# Patient Record
Sex: Female | Born: 1965 | Race: White | Hispanic: No | Marital: Single | Smoking: Former smoker
Health system: Southern US, Community
[De-identification: ages and names within clinical notes are randomized; demographics above are authoritative.]

## PROBLEM LIST (undated history)

## (undated) DIAGNOSIS — N3281 Overactive bladder: Secondary | ICD-10-CM

## (undated) DIAGNOSIS — E039 Hypothyroidism, unspecified: Secondary | ICD-10-CM

## (undated) DIAGNOSIS — K219 Gastro-esophageal reflux disease without esophagitis: Secondary | ICD-10-CM

## (undated) DIAGNOSIS — N393 Stress incontinence (female) (male): Secondary | ICD-10-CM

## (undated) DIAGNOSIS — Z973 Presence of spectacles and contact lenses: Secondary | ICD-10-CM

## (undated) DIAGNOSIS — E079 Disorder of thyroid, unspecified: Secondary | ICD-10-CM

## (undated) DIAGNOSIS — T7840XA Allergy, unspecified, initial encounter: Secondary | ICD-10-CM

## (undated) DIAGNOSIS — G4733 Obstructive sleep apnea (adult) (pediatric): Secondary | ICD-10-CM

## (undated) DIAGNOSIS — I10 Essential (primary) hypertension: Secondary | ICD-10-CM

## (undated) HISTORY — PX: SHOULDER SURGERY: SHX246

## (undated) HISTORY — DX: Gastro-esophageal reflux disease without esophagitis: K21.9

## (undated) HISTORY — DX: Essential (primary) hypertension: I10

## (undated) HISTORY — DX: Disorder of thyroid, unspecified: E07.9

## (undated) HISTORY — DX: Allergy, unspecified, initial encounter: T78.40XA

## (undated) HISTORY — PX: NOSE SURGERY: SHX723

---

## 1998-06-27 ENCOUNTER — Inpatient Hospital Stay (HOSPITAL_COMMUNITY): Admission: AD | Admit: 1998-06-27 | Discharge: 1998-06-27 | Payer: Self-pay | Admitting: Obstetrics & Gynecology

## 1998-09-29 ENCOUNTER — Ambulatory Visit (HOSPITAL_COMMUNITY): Admission: RE | Admit: 1998-09-29 | Discharge: 1998-09-29 | Payer: Self-pay | Admitting: Obstetrics and Gynecology

## 1998-12-13 ENCOUNTER — Inpatient Hospital Stay (HOSPITAL_COMMUNITY): Admission: AD | Admit: 1998-12-13 | Discharge: 1998-12-13 | Payer: Self-pay | Admitting: Obstetrics and Gynecology

## 1998-12-14 ENCOUNTER — Inpatient Hospital Stay (HOSPITAL_COMMUNITY): Admission: AD | Admit: 1998-12-14 | Discharge: 1998-12-15 | Payer: Self-pay | Admitting: Obstetrics and Gynecology

## 1998-12-14 ENCOUNTER — Inpatient Hospital Stay (HOSPITAL_COMMUNITY): Admission: AD | Admit: 1998-12-14 | Discharge: 1998-12-14 | Payer: Self-pay | Admitting: Obstetrics and Gynecology

## 1999-01-22 ENCOUNTER — Other Ambulatory Visit: Admission: RE | Admit: 1999-01-22 | Discharge: 1999-01-22 | Payer: Self-pay | Admitting: Obstetrics & Gynecology

## 2000-01-08 ENCOUNTER — Other Ambulatory Visit: Admission: RE | Admit: 2000-01-08 | Discharge: 2000-01-08 | Payer: Self-pay | Admitting: Obstetrics and Gynecology

## 2000-06-03 ENCOUNTER — Ambulatory Visit (HOSPITAL_COMMUNITY): Admission: RE | Admit: 2000-06-03 | Discharge: 2000-06-03 | Payer: Self-pay | Admitting: Obstetrics & Gynecology

## 2000-07-30 ENCOUNTER — Inpatient Hospital Stay (HOSPITAL_COMMUNITY): Admission: AD | Admit: 2000-07-30 | Discharge: 2000-07-30 | Payer: Self-pay | Admitting: *Deleted

## 2000-08-29 ENCOUNTER — Inpatient Hospital Stay (HOSPITAL_COMMUNITY): Admission: AD | Admit: 2000-08-29 | Discharge: 2000-08-31 | Payer: Self-pay | Admitting: Obstetrics and Gynecology

## 2001-04-30 ENCOUNTER — Other Ambulatory Visit: Admission: RE | Admit: 2001-04-30 | Discharge: 2001-04-30 | Payer: Self-pay | Admitting: Obstetrics and Gynecology

## 2002-05-17 ENCOUNTER — Other Ambulatory Visit: Admission: RE | Admit: 2002-05-17 | Discharge: 2002-05-17 | Payer: Self-pay | Admitting: Obstetrics and Gynecology

## 2003-05-24 ENCOUNTER — Other Ambulatory Visit: Admission: RE | Admit: 2003-05-24 | Discharge: 2003-05-24 | Payer: Self-pay | Admitting: Obstetrics and Gynecology

## 2004-01-02 ENCOUNTER — Emergency Department (HOSPITAL_COMMUNITY): Admission: EM | Admit: 2004-01-02 | Discharge: 2004-01-02 | Payer: Self-pay | Admitting: Family Medicine

## 2004-06-11 ENCOUNTER — Other Ambulatory Visit: Admission: RE | Admit: 2004-06-11 | Discharge: 2004-06-11 | Payer: Self-pay | Admitting: Obstetrics and Gynecology

## 2005-06-18 ENCOUNTER — Other Ambulatory Visit: Admission: RE | Admit: 2005-06-18 | Discharge: 2005-06-18 | Payer: Self-pay | Admitting: Obstetrics and Gynecology

## 2009-08-04 ENCOUNTER — Encounter: Admission: RE | Admit: 2009-08-04 | Discharge: 2009-08-04 | Payer: Self-pay | Admitting: Obstetrics and Gynecology

## 2010-08-19 HISTORY — PX: LAPAROSCOPIC CHOLECYSTECTOMY: SUR755

## 2010-08-19 HISTORY — PX: ABDOMINOPLASTY: SHX5355

## 2010-08-21 ENCOUNTER — Encounter
Admission: RE | Admit: 2010-08-21 | Discharge: 2010-09-18 | Payer: Self-pay | Source: Home / Self Care | Attending: Neurology | Admitting: Neurology

## 2010-09-11 ENCOUNTER — Ambulatory Visit
Admission: RE | Admit: 2010-09-11 | Discharge: 2010-09-11 | Payer: Self-pay | Source: Home / Self Care | Admitting: Emergency Medicine

## 2010-09-11 DIAGNOSIS — E039 Hypothyroidism, unspecified: Secondary | ICD-10-CM | POA: Insufficient documentation

## 2010-09-11 DIAGNOSIS — R42 Dizziness and giddiness: Secondary | ICD-10-CM | POA: Insufficient documentation

## 2010-09-20 NOTE — Assessment & Plan Note (Signed)
Summary: DIZZINESS? N   Vital Signs:  Patient Profile:   45 Years Old Female CC:      dizziness, ear pain x 3 days Height:     63 inches Weight:      166 pounds O2 Sat:      99 % O2 treatment:    Room Air Temp:     98.6 degrees F oral Pulse rate:   87 / minute Resp:     12 per minute BP sitting:   143 / 100  (left arm) Cuff size:   regular  Vitals Entered By: Lajean Saver RN (September 11, 2010 11:12 AM)                  Updated Prior Medication List: SYNTHROID 100 MCG TABS (LEVOTHYROXINE SODIUM) once daily AMBIEN CR 6.25 MG CR-TABS (ZOLPIDEM TARTRATE)  PROTONIX 40 MG TBEC (PANTOPRAZOLE SODIUM)  * NASONEX  * MUSCLES RELAXER- UNKNOWN NAME   Current Allergies: No known allergies History of Present Illness Chief Complaint: dizziness, ear pain x 3 days History of Present Illness: Mild dizziness and L ear pain for 3 days.  Was just in the mountains skiing this past weekend.  Had dizziness similar a few months ago and was treated for a chronic sinus infection by ENT.  Dizziness is only when arising from laying down, not with turning head/neck.  Also with L ear pressure and throat pain.  She reports that it's mild and she'd not come here except she wants to have her ears looked at.  Is on meds for hypothyroid but reports her TSH and CBC are normal.  Also recently started on Nortriptyline for back/shoulder pain.  No heavy periods.  No HA, CP, SOB, or other severe symptoms.  She takes Zyrtec OTC for allergy symptoms.  REVIEW OF SYSTEMS Constitutional Symptoms       Complains of weight gain.     Denies fever, chills, night sweats, weight loss, and fatigue.  Eyes       Denies change in vision, eye pain, eye discharge, glasses, contact lenses, and eye surgery. Ear/Nose/Throat/Mouth       Complains of ear pain and dizziness.      Denies hearing loss/aids, change in hearing, ear discharge, frequent runny nose, frequent nose bleeds, sinus problems, sore throat, hoarseness, and tooth pain  or bleeding.  Respiratory       Denies dry cough, productive cough, wheezing, shortness of breath, asthma, bronchitis, and emphysema/COPD.  Cardiovascular       Denies murmurs, chest pain, and tires easily with exhertion.    Gastrointestinal       Denies stomach pain, nausea/vomiting, diarrhea, constipation, blood in bowel movements, and indigestion. Genitourniary       Denies painful urination, kidney stones, and loss of urinary control. Neurological       Denies paralysis, seizures, and fainting/blackouts. Musculoskeletal       Denies muscle pain, joint pain, joint stiffness, decreased range of motion, redness, swelling, muscle weakness, and gout.  Skin       Denies bruising, unusual mles/lumps or sores, and hair/skin or nail changes.  Psych       Denies mood changes, temper/anger issues, anxiety/stress, speech problems, depression, and sleep problems. Other Comments: dizziness x 3 days. Denies vision disturbances or HA   Past History:  Past Medical History: Hypothyroidism neck pain/spasms sinus infections  Past Surgical History: Caesarean section  Family History: none  Social History: Married Never Smoked Alcohol use-yes Drug use-no Smoking Status:  never Drug Use:  no Physical Exam General appearance: well developed, well nourished, no acute distress Head: normocephalic, atraumatic Eyes: PERRLA EOMI, no nystagmus Ears: normal, no lesions or deformities Nasal: mucosa pink, nonedematous, no septal deviation, turbinates normal Oral/Pharynx: tongue normal, posterior pharynx without erythema or exudate Neck: neck supple,  trachea midline, no masses Chest/Lungs: no rales, wheezes, or rhonchi bilateral, breath sounds equal without effort Heart: regular rate and  rhythm, no murmur Neurological: CN2-12 intact, normal strength all 4 ext, no gross neuro deficit seen Skin: no obvious rashes or lesions MSE: oriented to time, place, and person Assessment New  Problems: DIZZINESS (ICD-780.4) HYPOTHYROIDISM (ICD-244.9)   Plan New Orders: New Patient Level II [99202] Planning Comments:   Talked about possible causes of dizziness including Eustachian tube dysfunction (most likely diagnosis) especially after being in the mountains this past weekend.  However, shouldn't take Sudafed because of HTN.  Doesn't sound like vertigo.  May be a side effect of Nortriptyline and she states that she is tapering off of it because of weight gain.  Her HTN may be causing it as well and she should follow up with her PCP for serial measurements.  Also may be a sinus infection.  As of today, will not treat to see what occurs.  However if getting worse, would refer her back to her ENT or treat with an antibiotic to see if that helps.  She doesn't want to try Meclizine or any nausea meds.  Follow-up with your primary care physician if not improving or if getting worse   The patient and/or caregiver has been counseled thoroughly with regard to medications prescribed including dosage, schedule, interactions, rationale for use, and possible side effects and they verbalize understanding.  Diagnoses and expected course of recovery discussed and will return if not improved as expected or if the condition worsens. Patient and/or caregiver verbalized understanding.   Orders Added: 1)  New Patient Level II [99202]

## 2011-01-04 NOTE — Op Note (Signed)
[facility]  Patient:    Carol Lynch, Carol Lynch                      MRN: [id number] Proc. Date: 08/29/00 Adm. Date:  [id number] Attending:  Osborn Coho                           Operative Report  PREOPERATIVE DIAGNOSIS:       Breech presentation, double footling.  POSTOPERATIVE DIAGNOSES:      1. Breech presentation, double footling.                               2. Viable female infant, Apgar 8 and 9.  PROCEDURE:                    Primary low planned transverse cesarean section                               that had to be converted to a classical cesarean                               section.  SURGEON:                      Miguel Aschoff, M.D.  ANESTHESIA:                   Epidural.  COMPLICATIONS:                None.  JUSTIFICATION:                The patient is a 45 year old white female, gravida 3, para 1-0-1-1, with an estimated date of confinement of September 01, 2000. The patient was admitted and noted to have a transverse lie. Initial attempt was made to externally vert this to a vertex. It had seemed to be successful; however, the lie reconverted back to a transverse lie. An epidural anesthetic was placed and the patient was taken to the operating room in attempt to perform external version. At this point, however, ultrasound revealed it to be a double footling breech. It was felt that further attempts at version were not indicated and a cesarean section was performed.  ESTIMATED BLOOD LOSS:         700-800 cc.  DESCRIPTION OF PROCEDURE:     Patient was taken to the operating room, placed in a supine position, deviated to the left and prepped and draped in the usual sterile fashion. Foley catheter was inserted. Pfannenstiel incision was made, extended down through the subcutaneous tissue with bleeding points being clamped and coagulated as they were encountered. The fascia was then identified and incised transversely and separated  from the underlying rectus muscles. Rectus muscles were divided in midline. Peritoneum was then identified and entered carefully avoiding underlying structures. The bladder flap was then created and protected with the bladder blade. An elliptical transverse incision was made into the lower uterine segment. The amniotic cavity was entered, meconium-stained fluid was obtained. The presenting part on the low transverse section were both feet and attempt was made to delivery the baby as a doubling footling breech; however, there appeared to be constriction ring present that would not allow delivery of the  buttocks. It was therefore necessary to perform a classical extension to this low transverse incision. Once this was done, the baby was easily delivered as a double footling breech, left sacrum anterior, without difficulty. There was nuchal cord x 2 noted. The nose and mouth were suctioned then the baby was DeLee suctioned. Cord was doubly clamped and then cut and then the baby was taken to the pediatric team in attendance. Cord bloods were obtained for appropriate studies. Placenta was then delivered. The uterus was evacuated of any remaining products of conception. The angles of the uterine incision were then identified and ligated using figure-of-eight sutures of #1 Vicryl. The classical cesarean section portion of the uterine incision was then closed in four layers with running interlocking Vicryl suture. It was possible to reapproximate the serosa and mucosa well. After this was done, the low transverse portion of the incision was closed using running interlocking 0 Vicryl sutures followed by an imbricating suture of 0 Vicryl. There was excellent hemostasis present. The bladder flap was then reapproximated using running continuous 2-0 Vicryl suture. Tubes and ovaries were inspected and noted to be within normal limits. Lap counts were taken and found to be correct. The abdomen was then  irrigated with warm saline. The parietal peritoneum was then closed using running continuous 0 Vicryl suture. The fascia was then closed using two sutures of 0 Vicryl, each starting at lateral fascia angles and meeting in the midline. Subcutaneous tissue and skin were closed using staples. The estimated blood loss was approximately 700-800 cc. Patient tolerated the procedure well and went to the recovery room in satisfactory condition. The baby was taken to the nursery in satisfactory condition. DD:  08/29/00 TD:  08/30/00 Job: [id number]

## 2011-01-04 NOTE — Discharge Summary (Signed)
[facility] of [facility]  Patient:    Carol Lynch, Carol Lynch                      MRN: [id number] Adm. Date:  [id number] Disc. Date: [id number] Attending:  Osborn Coho Dictator:   Leilani Able, P.A.                           Discharge Summary  FINAL DIAGNOSES:              1. Intrauterine pregnancy at term.                               2. Breech presentation, doubling footling.  PROCEDURE:                    Primary low planned transverse cesarean section                               that had to be converted to a classical cesarean                               section.  SURGEON:                      Miguel Aschoff, M.D.  COMPLICATIONS:                None.  HOSPITAL COURSE:              This patient was admitted on January 11 in active labor. The patients cervix was about 5 cm dilated. She was found to have an oblique lie. The fetal head at this point was moved to vertex and after version, the cervix started dilating a little bit more. The patients prenatal course had been complicated by hypothyroidism for which she was on Synthroid. 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 She did receive RhoGAM at 28 weeks. On the morning of January 11, when Dr. Tenny Craw came on call, the lie was back to transverse without any presenting part. An epidural was placed and the patient was taken to the OR for a cesarean section. The patient was taken to the operating room by Dr. Miguel Aschoff where a primary low transverse cesarean section was performed that was converted to a classical cesarean section. The patient had the delivery of a 8-pound 15-ounce female infant with Apgars of 8 and 9. Delivery went without complications.  The patients postoperative course was complicated by some low-grade temperatures but she was felt ready for discharge on postoperative day #2.  DISCHARGE INSTRUCTIONS:       The patient was sent  home on a regular diet, told to decrease activity.  DISCHARGE MEDICATIONS:        1. Continue prenatal vitamins and FeSO4.                               2. Tylox one to two every three hours as needed  for pain.                               3. Use over-the-counter medications as needed.  FOLLOWUP:                     The patient was also to follow up in the office in four weeks. DD:  09/24/00 TD:  09/24/00 Job: [id number]

## 2013-08-19 HISTORY — PX: SHOULDER ARTHROSCOPY W/ ROTATOR CUFF REPAIR: SHX2400

## 2014-05-04 ENCOUNTER — Ambulatory Visit (INDEPENDENT_AMBULATORY_CARE_PROVIDER_SITE_OTHER): Payer: Managed Care, Other (non HMO) | Admitting: Internal Medicine

## 2014-05-04 ENCOUNTER — Encounter: Payer: Self-pay | Admitting: Internal Medicine

## 2014-05-04 VITALS — BP 110/70 | HR 86 | Temp 97.6°F | Resp 12 | Ht 64.0 in | Wt 164.0 lb

## 2014-05-04 DIAGNOSIS — E039 Hypothyroidism, unspecified: Secondary | ICD-10-CM

## 2014-05-04 DIAGNOSIS — R7301 Impaired fasting glucose: Secondary | ICD-10-CM

## 2014-05-04 MED ORDER — SYNTHROID 100 MCG PO TABS: 100.0000 ug | ORAL_TABLET | Freq: Every day | ORAL | Status: AC

## 2014-05-04 NOTE — Progress Notes (Signed)
Patient ID: Carol Lynch, female   DOB: [birth year], 48 y.o.   MRN: [id number]   HPI  Carol Lynch is a 48 y.o.-year-old female, referred by her PCP, Dr. Clelia Croft, for management of hypothyroidism.  Pt. has been dx with hypothyroidism in 1994; is on Synthroid DAW 100 mcg 6/7 days, taken: - fasting - with water - separated by >60-120 min from b'fast  - no calcium, iron, multivitamins  - on PPIs -  At night  I reviewed pt's thyroid tests:  Pt describes: - occasional fatigue - + weight gain - no cold intolerance/+ heat intolerance - + constipation >> vegan diet helped (started 2 mo ago) - no dry skin - no hair falling - + anxiety/ +depression - no menses x 2 years (early menopause)  She is seeing nutritionist - gluten free, vegan diet  Pt denies feeling nodules in neck, hoarseness, dysphagia/odynophagia, SOB with lying down.  She has + FH of thyroid disorders in: mother, MGM (hypothyroidism). No FH of thyroid cancer.  No h/o radiation tx to head or neck. No recent use of iodine supplements.  I reviewed her chart and she also has a history of IFG (last year - was drinking mixed drinks at night >> stopped).   PMH: - hypothyroidism - HTN - IFG  PSH: - none  History   Social History  . Marital Status: Single    Spouse Name: N/A    Number of Children: 2   Occupational History  . Stay at home mom  Exercise: yoga 3x a week  Social History Main Topics  . Smoking status: Former Games developer, quit in 1996  . Smokeless tobacco: No  . Alcohol Use: No  . Drug Use: No   Current Outpatient Rx  Name  Route  Sig  Dispense  Refill  . cetirizine (KLS ALLER-TEC) 10 MG tablet   Oral   Take 10 mg by mouth at bedtime.         . Cholecalciferol (VITAMIN D3) 2000 UNITS TABS   Oral   Take 1 tablet by mouth daily.         . cloNIDine (CATAPRES) 0.1 MG tablet   Oral   Take 0.1 mg by mouth.         . hydrochlorothiazide (MICROZIDE) 12.5 MG capsule   Oral   Take 12.5 mg  by mouth.         . loratadine (CLARITIN) 10 MG tablet   Oral   Take 10 mg by mouth daily.         Marland Kitchen zolpidem (AMBIEN) 10 MG tablet   Oral   Take 10 mg by mouth at bedtime as needed for sleep.          NKDA  FH: relevant FH  - see HPI - DM2 in F,M - HTN in F - HL in F  ROS: Constitutional: see HPI, + hot flushes, + poor sleep Eyes: no blurry vision, no xerophthalmia ENT: no sore throat, no nodules palpated in throat, no dysphagia/odynophagia, no hoarseness Cardiovascular: no CP/SOB/palpitations/leg swelling Respiratory: no cough/SOB Gastrointestinal: no N/V/D/C Musculoskeletal: no muscle/joint aches Skin: no rashes Neurological: no tremors/numbness/tingling/dizziness Psychiatric: + depression/+ anxiety - going through a divorce now + low libido  PE: BP 110/70  Pulse 86  Temp(Src) 97.6 F (36.4 C) (Oral)  Resp 12  Ht  (1.626 m)  Wt 164 lb (74.39 kg)  BMI 28.14 kg/m2  SpO2 97%   Wt Readings from Last 3 Encounters:  05/04/14  164 lb (74.39 kg)  09/11/10 166 lb (75.297 kg)   Constitutional: overweight, in NAD Eyes: PERRLA, EOMI, no exophthalmos ENT: moist mucous membranes, no thyromegaly, no cervical lymphadenopathy Cardiovascular: RRR, No MRG Respiratory: CTA B Gastrointestinal: abdomen soft, NT, ND, BS+ Musculoskeletal: no deformities, strength intact in all 4 Skin: moist, warm, no rashes Neurological: no tremor with outstretched hands, DTR normal in all 4  ASSESSMENT: 1. Hypothyroidism  2. Insomnia  PLAN:  1. Patient with long-standing hypothyroidism, on Synthroid (DAW) therapy. She appears euthyroid. She does not appear to have a goiter, thyroid nodules, or neck compression symptoms - We discussed about correct intake of levothyroxine, fasting, with water, separated by at least 30 minutes from breakfast, and separated by more than 4 hours from calcium, iron, multivitamins, acid reflux medications (PPIs). She is taking it correctly - will check  thyroid tests today: TSH, free T4 - She is interested in a more natural approach to thyroid hh replacement. We discussed about positive and negative aspects of using Armour thyroid. I underlined the fact that:  Armour is purified from porcine thyroid glands, which is not without risk for contaminants  Also, the ratio between T4 and T3 in Armour is physiologic for pigs, not for humans.  The short half life of T3 can cause fluctuations in blood levels, which can result in mood swings and heart rhythm abnormalities.  The concentration of the active substances (T4 and T3) can be expected to vary between different Armour lots, which can cause variation in the thyroid function tests.  - she will let me know if she decides to try this - If these are abnormal, she will need to return in 6-8 weeks for repeat labs - If these are normal, I will see her back in 6 months  2. Insomnia - likely related to decreased progesterone (postmenopausal) - she will have an appt with ObGyn next mo >> advised her to discuss Prometrium at bedtime  Office Visit on 05/04/2014  Component Date Value Ref Range Status  . TSH 05/04/2014 0.96  0.35 - 4.50 uIU/mL Final  . Free T4 05/04/2014 1.17  0.60 - 1.60 ng/dL Final   Excellent TFTs - continue Synthroid 100 mcg daily.

## 2014-05-04 NOTE — Patient Instructions (Signed)
Please stop at the lab.  Please return in 6 months with your sugar log.   

## 2014-05-05 ENCOUNTER — Encounter: Payer: Self-pay | Admitting: Internal Medicine

## 2014-05-05 LAB — TSH: TSH: 0.96 u[IU]/mL (ref 0.35–4.50)

## 2014-05-05 LAB — T4, FREE: Free T4: 1.17 ng/dL (ref 0.60–1.60)

## 2014-11-02 ENCOUNTER — Ambulatory Visit: Payer: Managed Care, Other (non HMO) | Admitting: Internal Medicine

## 2014-12-08 ENCOUNTER — Encounter: Payer: Self-pay | Admitting: Physician Assistant

## 2014-12-26 ENCOUNTER — Ambulatory Visit: Payer: Managed Care, Other (non HMO) | Admitting: Physician Assistant

## 2014-12-27 ENCOUNTER — Ambulatory Visit: Payer: Managed Care, Other (non HMO) | Admitting: Physician Assistant

## 2015-01-02 ENCOUNTER — Other Ambulatory Visit: Payer: Self-pay | Admitting: Gastroenterology

## 2015-01-02 DIAGNOSIS — R110 Nausea: Secondary | ICD-10-CM

## 2015-01-02 DIAGNOSIS — R1013 Epigastric pain: Secondary | ICD-10-CM

## 2015-01-23 ENCOUNTER — Ambulatory Visit (HOSPITAL_COMMUNITY)
Admission: RE | Admit: 2015-01-23 | Discharge: 2015-01-23 | Disposition: A | Discharge status: Home / Self Care | Payer: BLUE CROSS/BLUE SHIELD | Source: Ambulatory Visit | Attending: Gastroenterology | Admitting: Gastroenterology

## 2015-01-23 ENCOUNTER — Encounter (HOSPITAL_COMMUNITY)
Admission: RE | Admit: 2015-01-23 | Discharge: 2015-01-23 | Disposition: A | Discharge status: Home / Self Care | Payer: BLUE CROSS/BLUE SHIELD | Source: Ambulatory Visit | Attending: Gastroenterology | Admitting: Gastroenterology

## 2015-01-23 DIAGNOSIS — R110 Nausea: Secondary | ICD-10-CM | POA: Insufficient documentation

## 2015-01-23 DIAGNOSIS — R1013 Epigastric pain: Secondary | ICD-10-CM | POA: Insufficient documentation

## 2015-01-23 MED ORDER — TECHNETIUM TC 99M MEBROFENIN IV KIT
5.0000 | PACK | Freq: Once | INTRAVENOUS | Status: AC | PRN
  Administered 2015-01-23: 5 via INTRAVENOUS

## 2015-01-23 MED ORDER — SINCALIDE 5 MCG IJ SOLR
INTRAMUSCULAR | Status: AC
  Filled 2015-01-23: qty 5

## 2015-01-23 MED ORDER — SINCALIDE 5 MCG IJ SOLR
0.0200 ug/kg | Freq: Once | INTRAMUSCULAR | Status: AC
  Administered 2015-01-23: 1.49 ug via INTRAVENOUS

## 2015-02-14 ENCOUNTER — Other Ambulatory Visit: Payer: Self-pay | Admitting: Surgery

## 2015-06-06 ENCOUNTER — Encounter: Payer: Self-pay | Admitting: Podiatry

## 2015-06-06 ENCOUNTER — Ambulatory Visit (INDEPENDENT_AMBULATORY_CARE_PROVIDER_SITE_OTHER): Payer: BLUE CROSS/BLUE SHIELD

## 2015-06-06 ENCOUNTER — Ambulatory Visit (INDEPENDENT_AMBULATORY_CARE_PROVIDER_SITE_OTHER): Payer: BLUE CROSS/BLUE SHIELD | Admitting: Podiatry

## 2015-06-06 VITALS — BP 107/76 | HR 79 | Resp 16 | Ht 64.0 in | Wt 165.0 lb

## 2015-06-06 DIAGNOSIS — M722 Plantar fascial fibromatosis: Secondary | ICD-10-CM | POA: Diagnosis not present

## 2015-06-06 DIAGNOSIS — M79673 Pain in unspecified foot: Secondary | ICD-10-CM

## 2015-06-06 MED ORDER — MELOXICAM 15 MG PO TABS: 15.0000 mg | ORAL_TABLET | Freq: Every day | ORAL | Status: DC

## 2015-06-06 MED ORDER — METHYLPREDNISOLONE 4 MG PO TBPK: ORAL_TABLET | ORAL | Status: DC

## 2015-06-06 NOTE — Progress Notes (Signed)
   Subjective:    Patient ID: Carol Lynch, female    DOB: 01/14/1966, 49 y.o.   MRN: [id number]  HPI: She presents today with several month duration of pain to the bilateral heels. She states that she's had plantar fasciitis before but this just really doesn't feel like it. She states that she is having more lateral pain to the left foot and plantar medial pain. She's done nothing for this and does have a plantar fascial brace at home. She presents today in a pair of flip-flops.    Review of Systems  All other systems reviewed and are negative.      Objective:   Physical Exam: 49 year old white female in no distress vital signs stable alert and oriented 3. Pulses are strongly palpable. Neurologic sensorium is intact for Semmes-Weinstein monofilament. Deep tendon reflexes are intact bilaterally muscle strength +5 over 5 flexion plantar flexors and inverters and everters all intrinsic musculature is intact. Orthopedic evaluation demonstrates all just distal to the ankle for range of motion without crepitation. She has severe pain on palpation medial calcaneal tubercles bilateral. [place] confirmed 3 views taken today in the office demonstrate soft tissue increase in density at the plantar fascial calcaneal insertion site with a small plantar distally oriented calcaneal heel spur. Cutaneous evaluation demonstrates a well-hydrated cutis no erythema edema cellulitis drainage or odor. Assessment          Assessment & Plan:  Plantar fasciitis bilateral.  Plan: Start her on a Medrol Dosepak to be followed by meloxicam. I injected the bilateral heels today with Kenalog and local anesthetic she tolerated this procedure well N plantar fascial brace bilateral and single night splint. I will follow up with her in 1 month. We discussed appropriate shoe gear stretching exercises ice therapy issue modifications.  Arbutus Pedodd Herschel Fleagle DPM

## 2015-06-06 NOTE — Patient Instructions (Signed)

## 2015-07-20 ENCOUNTER — Ambulatory Visit: Payer: BLUE CROSS/BLUE SHIELD | Admitting: Podiatry

## 2015-07-25 ENCOUNTER — Ambulatory Visit (INDEPENDENT_AMBULATORY_CARE_PROVIDER_SITE_OTHER): Payer: BLUE CROSS/BLUE SHIELD | Admitting: Podiatry

## 2015-07-25 ENCOUNTER — Encounter: Payer: Self-pay | Admitting: Podiatry

## 2015-07-25 VITALS — BP 130/99 | HR 78 | Resp 16

## 2015-07-25 DIAGNOSIS — M722 Plantar fascial fibromatosis: Secondary | ICD-10-CM | POA: Diagnosis not present

## 2015-07-25 NOTE — Progress Notes (Signed)
She presents today for follow-up of bilateral plantar fasciitis. She states that she's been doing everything we have asked ask of her including utilizing meloxicam plantar fascial brace in nicely. Appropriate shoe gear and stretching exercises. She states that the right foot feels great the left foot is still somewhat tender.  Objective: Vital signs are stable alert and oriented 3. Pulses are strongly palpable. Neurologic sensorium is intact. She has pain on palpation medial calcaneal tubercle of the left foot much less degree on the right foot.  Assessment: Resolving plantar fasciitis right slowly resolving plantar fasciitis left.  Plan: Reinjected the left heel today with Kenalog and local anesthetic continue all conservative therapies as above.

## 2015-08-22 ENCOUNTER — Ambulatory Visit: Payer: BLUE CROSS/BLUE SHIELD | Admitting: Podiatry

## 2015-08-24 ENCOUNTER — Ambulatory Visit: Payer: BLUE CROSS/BLUE SHIELD | Admitting: Podiatry

## 2015-10-01 ENCOUNTER — Other Ambulatory Visit: Payer: Self-pay | Admitting: Podiatry

## 2016-10-11 ENCOUNTER — Other Ambulatory Visit: Payer: Self-pay | Admitting: Gastroenterology

## 2016-10-11 DIAGNOSIS — R109 Unspecified abdominal pain: Secondary | ICD-10-CM

## 2016-10-15 ENCOUNTER — Ambulatory Visit
Admission: RE | Admit: 2016-10-15 | Discharge: 2016-10-15 | Disposition: A | Discharge status: Home / Self Care | Payer: BLUE CROSS/BLUE SHIELD | Source: Ambulatory Visit | Attending: Gastroenterology | Admitting: Gastroenterology

## 2016-10-15 DIAGNOSIS — R109 Unspecified abdominal pain: Secondary | ICD-10-CM

## 2016-10-15 MED ORDER — IOPAMIDOL (ISOVUE-300) INJECTION 61%
100.0000 mL | Freq: Once | INTRAVENOUS | Status: AC | PRN
  Administered 2016-10-15: 100 mL via INTRAVENOUS

## 2017-09-08 ENCOUNTER — Other Ambulatory Visit: Payer: Self-pay | Admitting: Obstetrics and Gynecology

## 2017-09-08 DIAGNOSIS — R928 Other abnormal and inconclusive findings on diagnostic imaging of breast: Secondary | ICD-10-CM

## 2017-09-11 ENCOUNTER — Encounter: Payer: Self-pay | Admitting: Radiology

## 2017-09-11 ENCOUNTER — Ambulatory Visit
Admission: RE | Admit: 2017-09-11 | Discharge: 2017-09-11 | Disposition: A | Discharge status: Home / Self Care | Payer: BLUE CROSS/BLUE SHIELD | Source: Ambulatory Visit | Attending: Obstetrics and Gynecology | Admitting: Obstetrics and Gynecology

## 2017-09-11 ENCOUNTER — Other Ambulatory Visit: Payer: Self-pay | Admitting: Obstetrics and Gynecology

## 2017-09-11 DIAGNOSIS — R928 Other abnormal and inconclusive findings on diagnostic imaging of breast: Secondary | ICD-10-CM

## 2017-09-11 DIAGNOSIS — R921 Mammographic calcification found on diagnostic imaging of breast: Secondary | ICD-10-CM

## 2018-04-03 ENCOUNTER — Ambulatory Visit
Admission: RE | Admit: 2018-04-03 | Discharge: 2018-04-03 | Disposition: A | Discharge status: Home / Self Care | Payer: BLUE CROSS/BLUE SHIELD | Source: Ambulatory Visit | Attending: Obstetrics and Gynecology | Admitting: Obstetrics and Gynecology

## 2018-04-03 DIAGNOSIS — R921 Mammographic calcification found on diagnostic imaging of breast: Secondary | ICD-10-CM

## 2018-09-09 ENCOUNTER — Other Ambulatory Visit: Payer: Self-pay | Admitting: Obstetrics and Gynecology

## 2018-09-09 DIAGNOSIS — R928 Other abnormal and inconclusive findings on diagnostic imaging of breast: Secondary | ICD-10-CM

## 2018-10-21 ENCOUNTER — Ambulatory Visit
Admission: RE | Admit: 2018-10-21 | Discharge: 2018-10-21 | Disposition: A | Discharge status: Home / Self Care | Payer: Self-pay | Source: Ambulatory Visit | Attending: Obstetrics and Gynecology | Admitting: Obstetrics and Gynecology

## 2018-10-21 DIAGNOSIS — R928 Other abnormal and inconclusive findings on diagnostic imaging of breast: Secondary | ICD-10-CM

## 2019-07-26 ENCOUNTER — Other Ambulatory Visit: Payer: Self-pay

## 2019-07-26 DIAGNOSIS — Z20822 Contact with and (suspected) exposure to covid-19: Secondary | ICD-10-CM

## 2019-07-27 LAB — NOVEL CORONAVIRUS, NAA: SARS-CoV-2, NAA: NOT DETECTED

## 2020-06-26 ENCOUNTER — Other Ambulatory Visit: Payer: Self-pay | Admitting: Internal Medicine

## 2020-06-26 DIAGNOSIS — H8110 Benign paroxysmal vertigo, unspecified ear: Secondary | ICD-10-CM

## 2020-06-26 DIAGNOSIS — H9191 Unspecified hearing loss, right ear: Secondary | ICD-10-CM

## 2020-07-02 ENCOUNTER — Other Ambulatory Visit: Payer: BC Managed Care – PPO

## 2020-09-08 IMAGING — MG DIGITAL DIAGNOSTIC BILATERAL MAMMOGRAM WITH TOMO AND CAD
6 of 10 series · 6 of 26 positions shown · non-contrast
Comparison: Previous exam(s).

CLINICAL DATA: 52-year-old patient presents for annual examination
and follow-up of probably benign right breast calcifications.

EXAM:
DIGITAL DIAGNOSTIC BILATERAL MAMMOGRAM WITH CAD AND TOMO

[R CC]
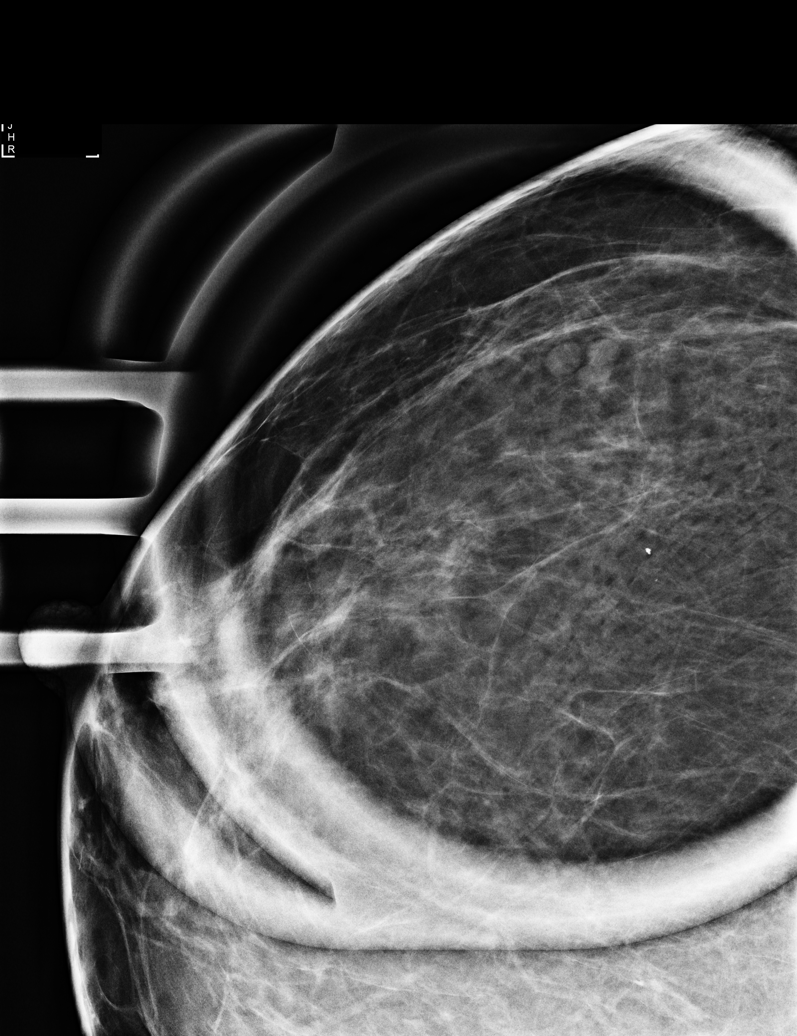

[R ML]
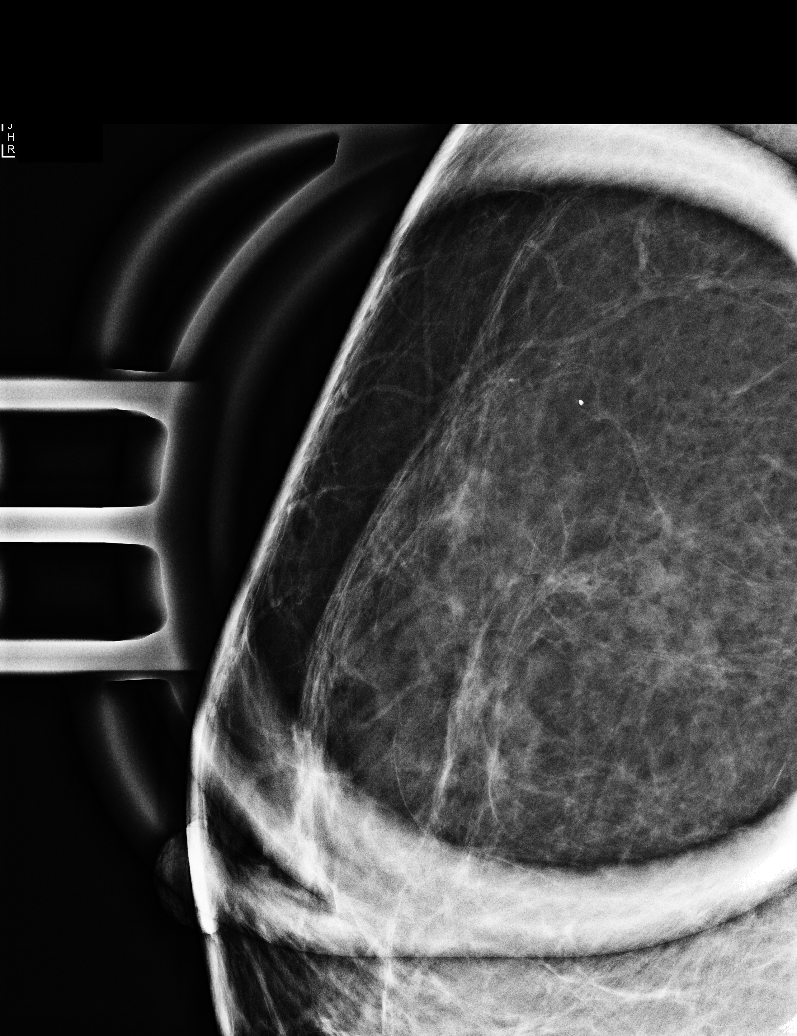

[L MLO synth-2D]
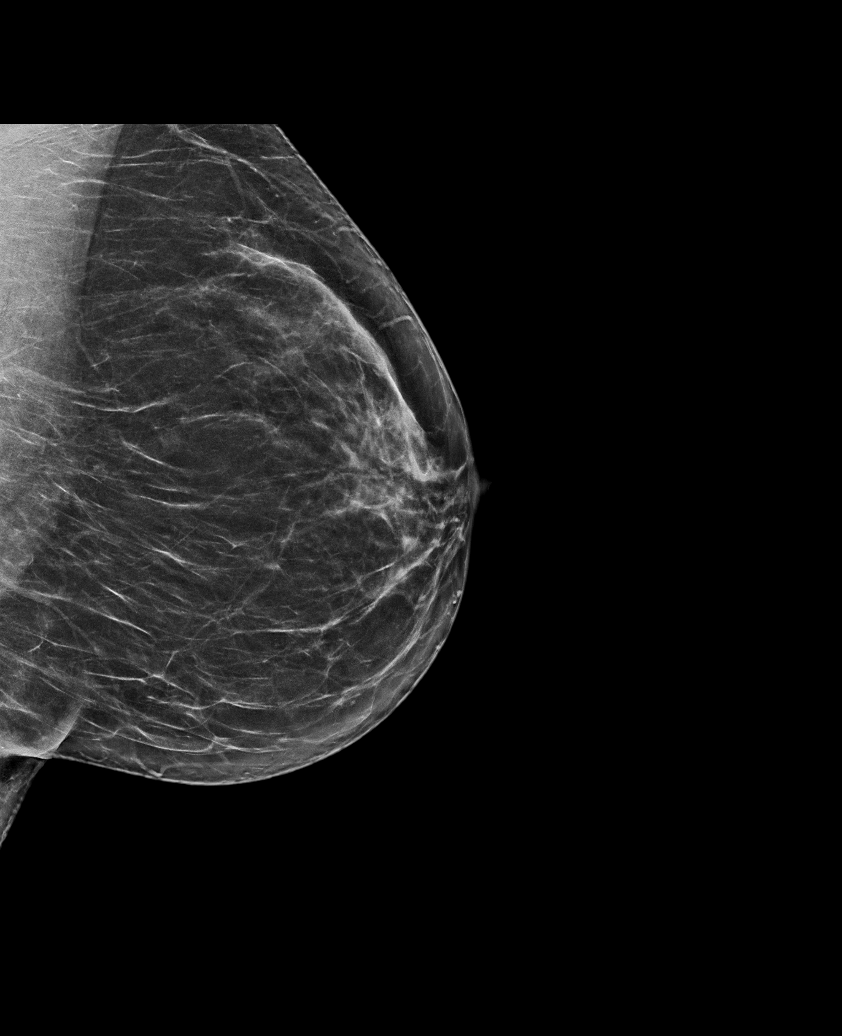

[R MLO synth-2D]
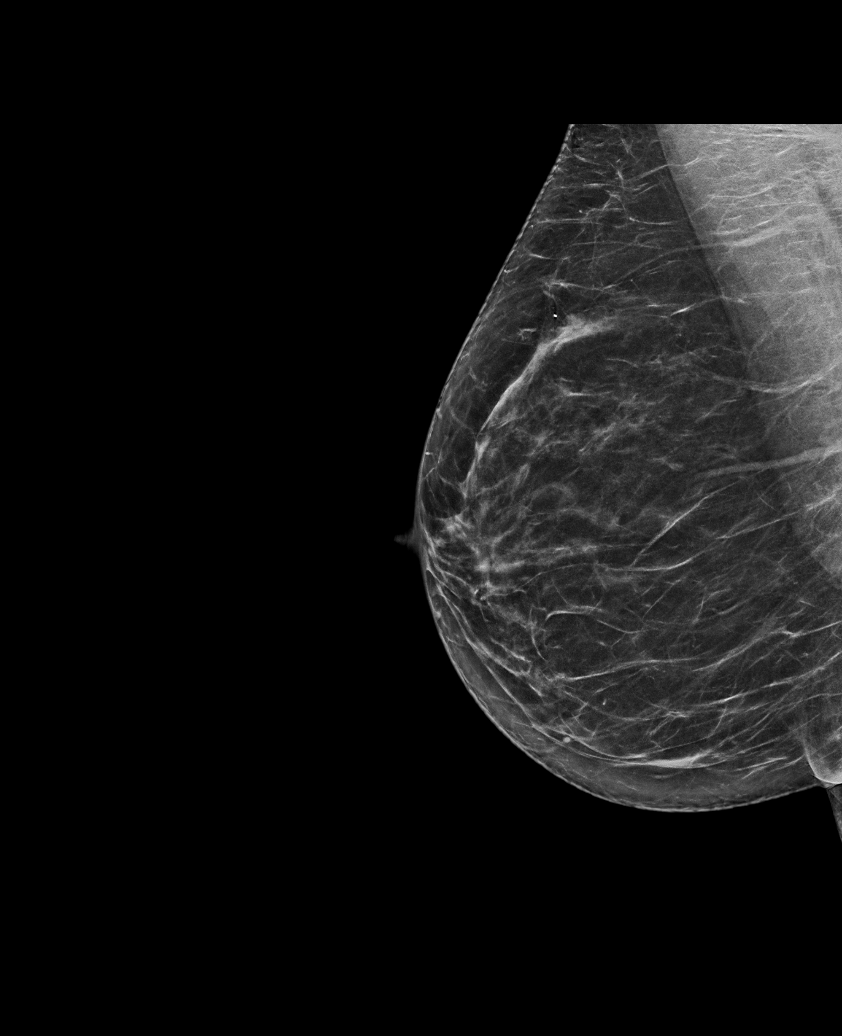

[R CC synth-2D]
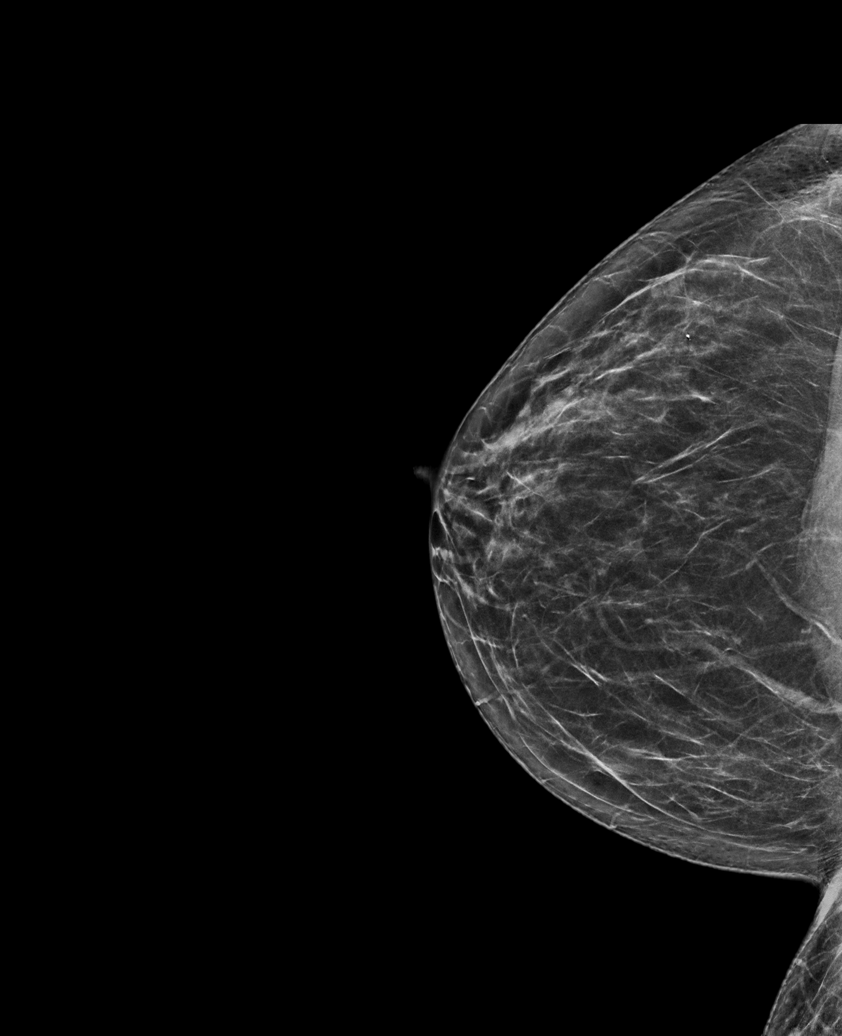

[L CC synth-2D]
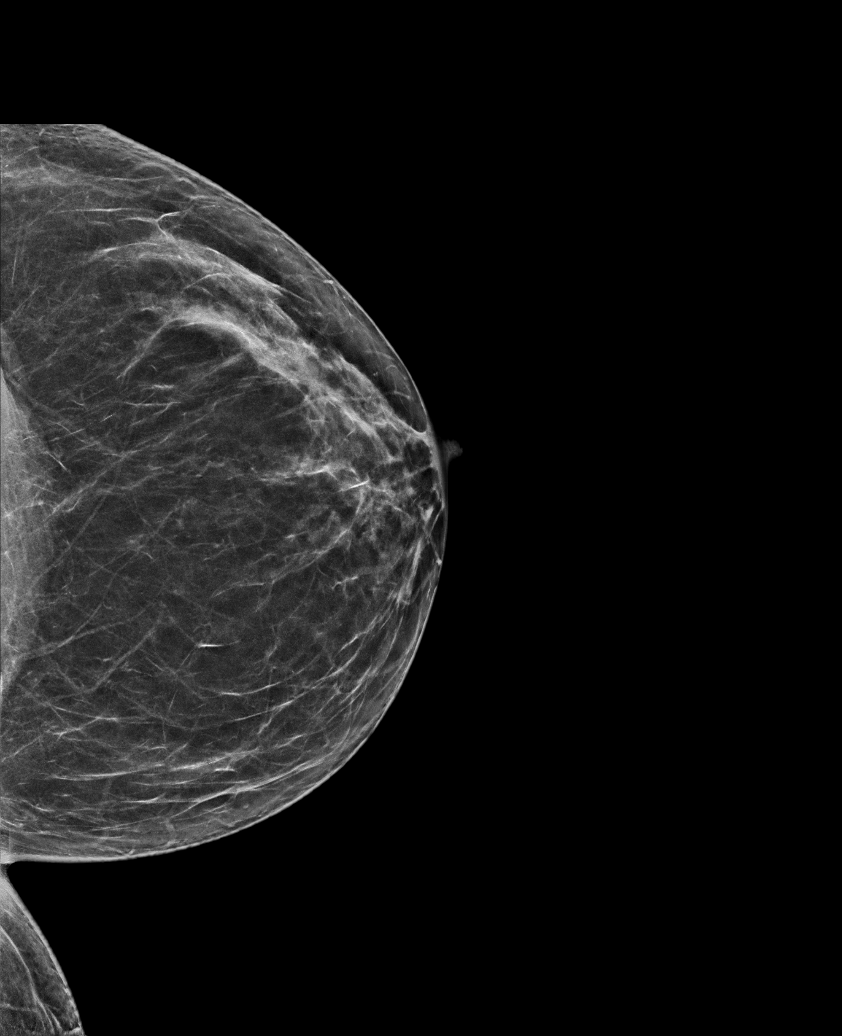

[6 of 26 positions shown; findings below may reference images not displayed]

ACR Breast Density Category b: There are scattered areas of
fibroglandular density.
FINDINGS: No mass, architectural distortion, or suspicious microcalcification
is identified to suggest malignancy in either breast. Magnification
views of the upper outer right breast show 2-3 stable benign
appearing calcifications.

Mammographic images were processed with CAD.
IMPRESSION: No evidence of malignancy in either breast. Benign right breast
calcifications.

RECOMMENDATION:
Screening mammogram in one year.(Code:[id number])

I have discussed the findings and recommendations with the patient.
Results were also provided in writing at the conclusion of the
visit. If applicable, a reminder letter will be sent to the patient
regarding the next appointment.

BI-RADS CATEGORY  2: Benign.

## 2020-11-17 DIAGNOSIS — Z8616 Personal history of COVID-19: Secondary | ICD-10-CM

## 2020-11-17 HISTORY — DX: Personal history of COVID-19: Z86.16

## 2021-10-29 ENCOUNTER — Ambulatory Visit: Payer: BC Managed Care – PPO | Admitting: Obstetrics and Gynecology

## 2021-10-29 ENCOUNTER — Encounter: Payer: Self-pay | Admitting: Obstetrics and Gynecology

## 2021-10-29 ENCOUNTER — Other Ambulatory Visit: Payer: Self-pay

## 2021-10-29 VITALS — BP 116/83 | HR 76 | Ht 63.5 in | Wt 150.0 lb

## 2021-10-29 DIAGNOSIS — N393 Stress incontinence (female) (male): Secondary | ICD-10-CM | POA: Diagnosis not present

## 2021-10-29 DIAGNOSIS — R350 Frequency of micturition: Secondary | ICD-10-CM

## 2021-10-29 DIAGNOSIS — N3281 Overactive bladder: Secondary | ICD-10-CM | POA: Diagnosis not present

## 2021-10-29 LAB — POCT URINALYSIS DIPSTICK
Appearance: NORMAL
Bilirubin, UA: NEGATIVE
Blood, UA: NEGATIVE
Glucose, UA: NEGATIVE
Ketones, UA: NEGATIVE
Leukocytes, UA: NEGATIVE
Nitrite, UA: NEGATIVE
Protein, UA: NEGATIVE
Spec Grav, UA: 1.025 (ref 1.010–1.025)
Urobilinogen, UA: 0.2 E.U./dL
pH, UA: 7 (ref 5.0–8.0)

## 2021-10-29 NOTE — Patient Instructions (Signed)
Today we talked about ways to manage bladder urgency such as altering your diet to avoid irritative beverages and foods (bladder diet) as well as attempting to decrease stress and other exacerbating factors.  Goal for water intake is about 60oz/ day.  ? ?The Most Bothersome Foods* The Least Bothersome Foods*  ?Coffee - Regular & Decaf ?Tea - caffeinated ?Carbonated beverages - cola, non-colas, diet & caffeine-free ?Alcohols - Beer, Red Wine, White Wine, [place] ?Fruits - Grapefruit, Lemon, Orange, Pineapple ?Fruit Juices - Cranberry, Grapefruit, Orange, Pineapple ?Vegetables - Tomato & Tomato Products ?Flavor Enhancers - Hot peppers, Spicy foods, Chili, Horseradish, Vinegar, Monosodium glutamate (MSG) ?Artificial Sweeteners - NutraSweet, Sweet 'N Low, Equal (sweetener), Saccharin ?Ethnic foods - [place], [place], [place] food Water ?Milk - low-fat & whole ?Fruits - Bananas, Blueberries, Honeydew melon, Pears, Raisins, Watermelon ?Vegetables - Broccoli, [address] Sprouts, [place], Carrots, Cauliflower, [place], Cucumber, Mushrooms, Peas, Radishes, Squash, Zucchini, White potatoes, Sweet potatoes & yams ?Poultry - Chicken, Eggs, [place], ?Meat - Beef, Pork, Lamb ?Seafood - Shrimp, Tuna fish, Salmon ?Grains - Oat, Rice ?Snacks - Pretzels, Popcorn  ?Lenward Chancellor et al. Diet and its role in interstitial cystitis/bladder pain syndrome (IC/BPS) and comorbid conditions. [employer]. [employer]. 2012 Jan 11.  ? ? ?

## 2021-10-29 NOTE — Progress Notes (Signed)
[facility] ?New Patient Evaluation and Consultation ? ?Referring Provider: Carrington ClampHorvath, Avigdor Dollar, MD ?PCP: Cleatis PolkaShaw, William D Jr., MD ?Date of Service: 10/29/2021 ? ?SUBJECTIVE ?Chief Complaint: New Patient (Initial Visit) Carol Lynch(Carol Lynch is a 56 y.o. female here for a consult on incontinence.) ? ?History of Present Illness: Carol Lynch is a 56 y.o. White or Caucasian female seen in consultation at the request of Dr. Henderson CloudHorvath for evaluation of incontinence.   ? ?Review of records from Dr Henderson CloudHorvath significant for: ?Complains of stress incontinence symptoms ? ?Urinary Symptoms: ?Leaks urine with cough/ sneeze, laughing, exercise, lifting, and with urgency. SUI = UUI ?Leaks 2+ time(s) per day.  ?As soon as she walks into her house, has urgency and leakage.  ?Pad use: 1 liners/ mini-pads per day.   ?She is bothered by her UI symptoms. ?Has tried a medication, but did not see improvement.  ? ?Day time voids 6.  Nocturia: 0 times per night to void. ?Voiding dysfunction: she empties her bladder well.  ?does not use a catheter to empty bladder.  ?When urinating, she feels dribbling after finishing and the need to urinate multiple times in a row ?Drinks: 20oz tea (chai with oat milk) in AM, small coke, club soda or water in evening. Does not like water.  ? ?UTIs: 4 UTI's in the last year.   ?Denies history of blood in urine and kidney or bladder stones ? ?Pelvic Organ Prolapse Symptoms:                  ?She Denies a feeling of a bulge the vaginal area.  ? ?Bowel Symptom: ?Bowel movements: daily ?Stool consistency: varies ?Straining: yes, sometimes ?Splinting: no.  ?Incomplete evacuation: no.  ?She Denies accidental bowel leakage / fecal incontinence ?Bowel regimen: sometimes uses stool softener ? ?Sexual Function ?Sexually active: yes.  ?Sexual orientation:  heterosexual ?Pain with sex: No ? ?Pelvic Pain ?Denies pelvic pain ? ? ?Past Medical History:  ?Past Medical History:  ?Diagnosis Date  ? Allergy   ? GERD  (gastroesophageal reflux disease)   ? Hypertension   ? Thyroid disease   ? ? ? ?Past Surgical History:   ?Past Surgical History:  ?Procedure Laterality Date  ? CESAREAN SECTION    ? NOSE SURGERY    ? SHOULDER SURGERY    ? ? ? ?Past OB/GYN History: ?OB History  ?Gravida Para Term Preterm AB Living  ?3         2  ?SAB IAB Ectopic Multiple Live Births  ?        2  ?  ?# Outcome Date GA Lbr Len/2nd Weight Sex Delivery Anes PTL Lv  ?3 Gravida           ?2 Gravida           ?1 Gravida           ? ? ?Vaginal deliveries: 3 ?Menopausal: Yes, at age 56, Denies vaginal bleeding since menopause ?Last pap smear was 05/2021.  Any history of abnormal pap smears: no. ? ? ?Medications: She has a current medication list which includes the following prescription(s): vitamin d3, eszopiclone, fluticasone, olmesartan, pantoprazole, synthroid, and vortioxetine hbr.  ? ?Allergies: Patient has No Known Allergies.  ? ?Social History:  ?Social History  ? ?Tobacco Use  ? Smoking status: Former  ? ? ?Relationship status: single ?She lives alone ?She is employed at [employer]. ?Regular exercise: Yes: walking, yoga, biking ?History of abuse: No ? ?Family History:   ?  Family History  ?Problem Relation Age of Onset  ? Hypertension Father   ? Hyperlipidemia Father   ? Diabetes Paternal Grandmother   ? ? ? ?Review of Systems: Review of Systems  ?Constitutional:  Negative for fever, malaise/fatigue and weight loss.  ?Respiratory:  Negative for cough, shortness of breath and wheezing.   ?Cardiovascular:  Negative for chest pain, palpitations and leg swelling.  ?Gastrointestinal:  Negative for abdominal pain and blood in stool.  ?Genitourinary:  Negative for dysuria.  ?Musculoskeletal:  Negative for myalgias.  ?Skin:  Negative for rash.  ?Neurological:  Negative for dizziness and headaches.  ?Endo/Heme/Allergies:  Does not bruise/bleed easily.  ?Psychiatric/Behavioral:  Positive for depression. The patient is nervous/anxious.    ? ? ?OBJECTIVE ?Physical Exam: ?Vitals:  ? 10/29/21 1613  ?BP: 116/83  ?Pulse: 76  ?Weight: 150 lb (68 kg)  ?Height: 5' 3.5" (1.613 m)  ? ? ?Physical Exam ?Constitutional:   ?   General: She is not in acute distress. ?Pulmonary:  ?   Effort: Pulmonary effort is normal.  ?Abdominal:  ?   General: There is no distension.  ?   Palpations: Abdomen is soft.  ?   Tenderness: There is no abdominal tenderness. There is no rebound.  ?Musculoskeletal:     ?   General: No swelling. Normal range of motion.  ?Skin: ?   General: Skin is warm and dry.  ?   Findings: No rash.  ?Neurological:  ?   Mental Status: She is alert and oriented to person, place, and time.  ?Psychiatric:     ?   Mood and Affect: Mood normal.     ?   Behavior: Behavior normal.  ? ? ? ?GU / Detailed Urogynecologic Evaluation:  ?Pelvic Exam: Normal external female genitalia; Bartholin's and Skene's glands normal in appearance; urethral meatus normal in appearance, no urethral masses or discharge.  ? ?CST: negative ? ?Speculum exam reveals normal vaginal mucosa with atrophy. Cervix normal appearance. Uterus normal single, nontender. Adnexa no mass, fullness, tenderness.   ? ? ?Pelvic floor strength 0/V- unable to contract pelvic floor muscles ? ?Pelvic floor musculature: Right levator non-tender, Right obturator non-tender, Left levator non-tender, Left obturator non-tender ? ?POP-Q:  ? ?POP-Q ? ?-3  ?                                          Aa   ?-3 ?                                          Ba  ?-9  ?                                            C  ? ?4  ?                                          Gh  ?3  ?  Pb  ?11  ?                                          tvl  ? ?-2  ?                                          Ap  ?-2  ?                                          Bp  ?-10  ?                                            D  ? ? ? ?Rectal Exam:  ?Normal external rectum ? ?Post-Void Residual (PVR) by Bladder Scan: ?In order  to evaluate bladder emptying, we discussed obtaining a postvoid residual and she agreed to this procedure. ? ?Procedure: The ultrasound unit was placed on the patient's abdomen in the suprapubic region after the patient had voided. A PVR of 0 ml was obtained by bladder scan. ? ?Laboratory Results: ?POC urine: negative ? ? ?ASSESSMENT AND PLAN ?Ms. Darnell is a 56 y.o. with:  ?1. SUI (stress urinary incontinence, female)   ?2. Overactive bladder   ?3. Urinary frequency   ? ?SUI ?- For treatment of stress urinary incontinence,  non-surgical options include expectant management, weight loss, physical therapy, as well as a pessary.  Surgical options include a midurethral sling,  and transurethral injection of a bulking agent. ?- She is interested in urethral bulking. Will have her return for simple CMG to demonstrate leakage. Handout provided. ? ?2. OAB ?- We discussed the symptoms of overactive bladder (OAB), which include urinary urgency, urinary frequency, nocturia, with or without urge incontinence.  While we do not know the exact etiology of OAB, several treatment options exist. We discussed management including behavioral therapy (decreasing bladder irritants, urge suppression strategies, timed voids, bladder retraining), physical therapy, medication. ?- Will decrease soda intake and drinking more water. Considering decreasing overall caffeine intake.  ? ?Return for simple CMG ? ?Marguerita Beards, MD ? ? ?Medical Decision Making:  ?- Reviewed/ ordered a clinical laboratory test ?- Reviewed/ ordered medicine test ?- Review and summation of prior records ? ? ?

## 2021-11-26 ENCOUNTER — Ambulatory Visit: Payer: BC Managed Care – PPO | Admitting: Obstetrics and Gynecology

## 2021-11-26 ENCOUNTER — Encounter: Payer: Self-pay | Admitting: Obstetrics and Gynecology

## 2021-11-26 VITALS — BP 126/85 | HR 73

## 2021-11-26 DIAGNOSIS — N393 Stress incontinence (female) (male): Secondary | ICD-10-CM | POA: Diagnosis not present

## 2021-11-26 DIAGNOSIS — N3281 Overactive bladder: Secondary | ICD-10-CM

## 2021-11-26 NOTE — Progress Notes (Signed)
[facility] ?Return Visit ? ?SUBJECTIVE  ?History of Present Illness: ?Carol Lynch is a 56 y.o. female seen in follow-up for mixed incontinence, stress predominant. She presents today for simple CMG and to discuss treatment.  ? ? ?Past Medical History: ?Patient  has a past medical history of Allergy, GERD (gastroesophageal reflux disease), Hypertension, and Thyroid disease.  ? ?Past Surgical History: ?She  has a past surgical history that includes Cesarean section; Nose surgery; and Shoulder surgery.  ? ?Medications: ?She has a current medication list which includes the following prescription(s): vitamin d3, eszopiclone, fluticasone, olmesartan, pantoprazole, synthroid, and vortioxetine hbr.  ? ?Allergies: ?Patient has No Known Allergies.  ? ?Social History: ?Patient  reports that she has quit smoking. She does not have any smokeless tobacco history on file.  ?  ?  ?OBJECTIVE  ?  ? ?Physical Exam: ?Vitals:  ? 11/26/21 1306  ?BP: 126/85  ?Pulse: 73  ? ?Gen: No apparent distress, A&O x 3. ? ?Detailed Urogynecologic Evaluation:  ?Deferred. Prior exam showed: ? ?POP-Q:  ?  ?POP-Q ?  ?-3  ?                                          Aa   ?-3 ?                                          Ba   ?-9  ?                                            C  ?  ?4  ?                                          Gh   ?3  ?                                          Pb   ?11  ?                                          tvl  ?  ?-2  ?                                          Ap   ?-2  ?                                          Bp   ?-10  ?  D  ?   ? ?Verbal consent was obtained to perform simple CMG procedure:  ? ?Prolapse was reduced using 2 large cotton swabs. Urethra was prepped with betadine and a 16F catheter was placed and bladder was drained completely. The bladder was then backfilled with sterile water by gravity.  ?First sensation: 274ml ?First Desire: 226ml ?Strong Desire:  342ml ?Capacity: 322ml, small DO was present at capacity.  ?Cough stress test was positive. Valsalva stress test was not done.  Catheter was replaced to drain the bladder. ? ?Interpretation: ?CMG showed normal sensation, and normal cystometric capacity. Findings positive for stress incontinence, positive for detrusor overactivity.  ? ? ? ?ASSESSMENT AND PLAN  ?  ?Carol Lynch is a 56 y.o. with:  ?1. SUI (stress urinary incontinence, female)   ?2. Overactive bladder   ? ? ?Plan for surgery: Exam under anesthesia, cystoscopy with urethral bulking ? ?- We reviewed the patient's specific anatomic and functional findings, with the assistance of diagrams, and together finalized the above procedure. The planned surgical procedures were discussed along with the surgical risks. Additional treatment options including expectant management, conservative management, medical management were discussed where appropriate.   ? ?General Surgical Risks: ?For all procedures, there are risks of bleeding, infection, damage to surrounding organs including but not limited to bowel, bladder, blood vessels, ureters and nerves, and need for further surgery if an injury were to occur. These risks are all low with minimally invasive surgery.  ? ?There is also a risk of short-term postoperative urinary retention with need to use a catheter. The risk of long-term need for a catheter is very low.  ? ?- Medical clearance: not required  ?- Anticoagulant use: No ?- Medicaid Hysterectomy form: n/a ?- Accepts blood transfusion: Yes ?- Expected length of stay: outpatient ? ?Request sent for surgery scheduling.  ? ?Jaquita Folds, MD ? ? ?

## 2021-12-21 NOTE — H&P (Signed)
[facility] ?Pre-Operative H&P ? ?Subjective ?Chief Complaint: Carol Lynch presents for a preoperative encounter.  ? ?History of Present Illness: ?Carol Lynch is a 56 y.o. female who presents for preoperative visit.  She is scheduled to undergo Exam under anesthesia, cystoscopy with urethral bulking on 12/31/21.  Her symptoms include stress urinary incontinence.  ? ?CMG Interpretation: ?CMG showed normal sensation, and normal cystometric capacity. Findings positive for stress incontinence, positive for detrusor overactivity.  ? ? ?Past Medical History:  ?Diagnosis Date  ? Allergy   ? GERD (gastroesophageal reflux disease)   ? Hypertension   ? Thyroid disease   ?  ? ?Past Surgical History:  ?Procedure Laterality Date  ? CESAREAN SECTION    ? NOSE SURGERY    ? SHOULDER SURGERY    ? ? ?has No Known Allergies.  ? ?Family History  ?Problem Relation Age of Onset  ? Hypertension Father   ? Hyperlipidemia Father   ? Diabetes Paternal Grandmother   ? ? ?Social History  ? ?Tobacco Use  ? Smoking status: Former  ? ? ? ?Review of Systems was negative for a full 10 system review except as noted in the History of Present Illness. ? ?No current facility-administered medications for this encounter. ? ?Current Outpatient Medications:  ?  Cholecalciferol (VITAMIN D3) 2000 UNITS TABS, Take 1 tablet by mouth daily., Disp: , Rfl:  ?  eszopiclone 3 MG TABS, Take 3 mg by mouth at bedtime. Take immediately before bedtime, Disp: , Rfl:  ?  fluticasone (FLONASE) 50 MCG/ACT nasal spray, USE SPRAY IN EACH NOSTRIL TWICE A DAY, Disp: , Rfl: 4 ?  olmesartan (BENICAR) 20 MG tablet, Take 20 mg by mouth daily., Disp: , Rfl:  ?  pantoprazole (PROTONIX) 40 MG tablet, Take 40 mg by mouth daily., Disp: , Rfl:  ?  SYNTHROID 100 MCG tablet, Take 1 tablet (100 mcg total) by mouth daily before breakfast., Disp: 90 tablet, Rfl: 3 ?  vortioxetine HBr (TRINTELLIX) 10 MG TABS tablet, Take 10 mg by mouth daily., Disp: , Rfl:    ? ?Objective ?There were no vitals filed for this visit. ? ?Gen: NAD ? ?Previous Pelvic Exam showed: ?POP-Q:  ?  ?POP-Q ?  ?-3  ?                                          Aa   ?-3 ?                                          Ba   ?-9  ?                                            C  ?  ?4  ?                                          Gh   ?3  ?  Pb   ?11  ?                                          tvl  ?  ?-2  ?                                          Ap   ?-2  ?                                          Bp   ?-10  ?                                            D  ?  ? ? ? ? ?Assessment/ Plan ? ?Assessment: ?The patient is a 56 y.o. year old with SUI scheduled to undergo Exam under anesthesia, cystoscopy with urethral bulking .  ? ?Jaquita Folds, MD ? ? ?

## 2021-12-26 ENCOUNTER — Encounter (HOSPITAL_BASED_OUTPATIENT_CLINIC_OR_DEPARTMENT_OTHER): Payer: Self-pay | Admitting: Obstetrics and Gynecology

## 2021-12-27 ENCOUNTER — Encounter (HOSPITAL_BASED_OUTPATIENT_CLINIC_OR_DEPARTMENT_OTHER): Payer: Self-pay | Admitting: Obstetrics and Gynecology

## 2021-12-27 ENCOUNTER — Other Ambulatory Visit: Payer: Self-pay

## 2021-12-27 NOTE — Progress Notes (Signed)
Spoke w/ via phone for pre-op interview--- pt ?Lab needs dos----    [facility] and ekg           ?Lab results------ no ?COVID test -----patient states asymptomatic no test needed ?Arrive at ------- 0900 on 12-31-2021 ?NPO after MN NO Solid Food.  Clear liquids from MN until--- 0800 ?Med rec completed ?Medications to take morning of surgery ----- trintellix, protonix, synthroid, estradial ?Diabetic medication ----- n/a ?Patient instructed no nail polish to be worn day of surgery ?Patient instructed to bring photo id and insurance card day of surgery ?Patient aware to have Driver (ride ) / caregiver  for 24 hours after surgery --friend, andrew ?Patient Special Instructions ----- n/a ?Pre-Op special Istructions ----- n/a ?Patient verbalized understanding of instructions that were given at this phone interview. ?Patient denies shortness of breath, chest pain, fever, cough at this phone interview.  ?

## 2021-12-29 NOTE — Anesthesia Preprocedure Evaluation (Addendum)
Anesthesia Evaluation  ?Patient identified by MRN, date of birth, ID band ?Patient awake ? ? ? ?Reviewed: ?Allergy & Precautions, NPO status , Patient's Chart, lab work & pertinent test results ? ?Airway ?Mallampati: II ? ?TM Distance: >3 FB ?Neck ROM: Full ? ? ? Dental ?no notable dental hx. ?(+) Teeth Intact, Dental Advisory Given ?  ?Pulmonary ?former smoker,  ?  ?Pulmonary exam normal ?breath sounds clear to auscultation ? ? ? ? ? ? Cardiovascular ?hypertension, Pt. on medications ?Normal cardiovascular exam ?Rhythm:Regular Rate:Normal ? ?5-15 EKG NSR ?  ?Neuro/Psych ?negative psych ROS  ? GI/Hepatic ?Neg liver ROS, GERD  ,  ?Endo/Other  ?Hypothyroidism  ? Renal/GU ?Lab Results ?     Component                Value               Date                 ?     CREATININE               0.70                12/31/2021           ?     BUN                      13                  12/31/2021           ?     NA                       141                 12/31/2021           ?     K                        4.0                 12/31/2021           ?     CL                       104                 12/31/2021           ?  ? ?  ?Musculoskeletal ?negative musculoskeletal ROS ?(+)  ? Abdominal ?  ?Peds ? Hematology ?Lab Results ?     Component                Value               Date                 ?     HGB                      15.0                12/31/2021           ?     HCT                      44.0  12/31/2021           ?   ?Anesthesia Other Findings ? ? Reproductive/Obstetrics ? ?  ? ? ? ? ? ? ? ? ? ? ? ? ? ?  ?  ? ? ? ? ? ? ? ?Anesthesia Physical ?Anesthesia Plan ? ?ASA: 2 ? ?Anesthesia Plan: MAC  ? ?Post-op Pain Management:   ? ?Induction:  ? ?PONV Risk Score and Plan: 3 and Treatment may vary due to age or medical condition, Ondansetron and Midazolam ? ?Airway Management Planned: Natural Airway and Nasal Cannula ? ?Additional Equipment: None ? ?Intra-op Plan:   ? ?Post-operative Plan:  ? ?Informed Consent: I have reviewed the patients History and Physical, chart, labs and discussed the procedure including the risks, benefits and alternatives for the proposed anesthesia with the patient or authorized representative who has indicated his/her understanding and acceptance.  ? ? ? ?Dental advisory given ? ?Plan Discussed with:  ? ?Anesthesia Plan Comments:   ? ? ? ? ? ?Anesthesia Quick Evaluation ? ?

## 2021-12-31 ENCOUNTER — Encounter (HOSPITAL_BASED_OUTPATIENT_CLINIC_OR_DEPARTMENT_OTHER): Payer: Self-pay | Admitting: Obstetrics and Gynecology

## 2021-12-31 ENCOUNTER — Ambulatory Visit (HOSPITAL_BASED_OUTPATIENT_CLINIC_OR_DEPARTMENT_OTHER): Payer: BC Managed Care – PPO | Admitting: Anesthesiology

## 2021-12-31 ENCOUNTER — Ambulatory Visit (HOSPITAL_BASED_OUTPATIENT_CLINIC_OR_DEPARTMENT_OTHER)
Admission: RE | Admit: 2021-12-31 | Discharge: 2021-12-31 | Disposition: A | Discharge status: Home / Self Care | Payer: BC Managed Care – PPO | Attending: Obstetrics and Gynecology | Admitting: Obstetrics and Gynecology

## 2021-12-31 ENCOUNTER — Encounter (HOSPITAL_BASED_OUTPATIENT_CLINIC_OR_DEPARTMENT_OTHER)
Admission: RE | Disposition: A | Discharge status: Home / Self Care | Payer: Self-pay | Source: Home / Self Care | Attending: Obstetrics and Gynecology

## 2021-12-31 DIAGNOSIS — Z79899 Other long term (current) drug therapy: Secondary | ICD-10-CM | POA: Diagnosis not present

## 2021-12-31 DIAGNOSIS — E039 Hypothyroidism, unspecified: Secondary | ICD-10-CM | POA: Insufficient documentation

## 2021-12-31 DIAGNOSIS — N393 Stress incontinence (female) (male): Secondary | ICD-10-CM | POA: Insufficient documentation

## 2021-12-31 DIAGNOSIS — I10 Essential (primary) hypertension: Secondary | ICD-10-CM | POA: Diagnosis not present

## 2021-12-31 DIAGNOSIS — K219 Gastro-esophageal reflux disease without esophagitis: Secondary | ICD-10-CM | POA: Insufficient documentation

## 2021-12-31 DIAGNOSIS — Z01818 Encounter for other preprocedural examination: Secondary | ICD-10-CM

## 2021-12-31 DIAGNOSIS — Z87891 Personal history of nicotine dependence: Secondary | ICD-10-CM | POA: Insufficient documentation

## 2021-12-31 HISTORY — DX: Overactive bladder: N32.81

## 2021-12-31 HISTORY — DX: Hypothyroidism, unspecified: E03.9

## 2021-12-31 HISTORY — DX: Presence of spectacles and contact lenses: Z97.3

## 2021-12-31 HISTORY — DX: Stress incontinence (female) (male): N39.3

## 2021-12-31 HISTORY — DX: Obstructive sleep apnea (adult) (pediatric): G47.33

## 2021-12-31 HISTORY — PX: CYSTOSCOPY: SHX5120

## 2021-12-31 LAB — POCT I-STAT, CHEM 8
BUN: 13 mg/dL (ref 6–20)
Calcium, Ion: 1.26 mmol/L (ref 1.15–1.40)
Chloride: 104 mmol/L (ref 98–111)
Creatinine, Ser: 0.7 mg/dL (ref 0.44–1.00)
Glucose, Bld: 115 mg/dL — ABNORMAL HIGH (ref 70–99)
HCT: 44 % (ref 36.0–46.0)
Hemoglobin: 15 g/dL (ref 12.0–15.0)
Potassium: 4 mmol/L (ref 3.5–5.1)
Sodium: 141 mmol/L (ref 135–145)
TCO2: 24 mmol/L (ref 22–32)

## 2021-12-31 SURGERY — INJECTION, BULKING AGENT, URETHRA
Anesthesia: Monitor Anesthesia Care | Site: Urethra

## 2021-12-31 MED ORDER — PROPOFOL 10 MG/ML IV BOLUS
INTRAVENOUS | Status: AC
  Filled 2021-12-31: qty 20

## 2021-12-31 MED ORDER — MIDAZOLAM HCL 5 MG/5ML IJ SOLN
INTRAMUSCULAR | Status: DC | PRN
  Administered 2021-12-31: 1 mg via INTRAVENOUS

## 2021-12-31 MED ORDER — CEFAZOLIN SODIUM-DEXTROSE 2-4 GM/100ML-% IV SOLN
2.0000 g | INTRAVENOUS | Status: AC
  Administered 2021-12-31: 2 g via INTRAVENOUS

## 2021-12-31 MED ORDER — LIDOCAINE HCL (PF) 2 % IJ SOLN
INTRAMUSCULAR | Status: AC
  Filled 2021-12-31: qty 5

## 2021-12-31 MED ORDER — FENTANYL CITRATE (PF) 100 MCG/2ML IJ SOLN
INTRAMUSCULAR | Status: DC | PRN
Start: 2021-12-31 — End: 2021-12-31
  Administered 2021-12-31: 50 ug via INTRAVENOUS

## 2021-12-31 MED ORDER — AMISULPRIDE (ANTIEMETIC) 5 MG/2ML IV SOLN: 10.0000 mg | Freq: Once | INTRAVENOUS | Status: DC | PRN

## 2021-12-31 MED ORDER — MIDAZOLAM HCL 2 MG/2ML IJ SOLN
INTRAMUSCULAR | Status: AC
  Filled 2021-12-31: qty 2

## 2021-12-31 MED ORDER — ONDANSETRON HCL 4 MG/2ML IJ SOLN
INTRAMUSCULAR | Status: AC
  Filled 2021-12-31: qty 2

## 2021-12-31 MED ORDER — OXYCODONE HCL 5 MG PO TABS: 5.0000 mg | ORAL_TABLET | Freq: Once | ORAL | Status: DC | PRN

## 2021-12-31 MED ORDER — ACETAMINOPHEN 10 MG/ML IV SOLN: 1000.0000 mg | Freq: Once | INTRAVENOUS | Status: DC | PRN

## 2021-12-31 MED ORDER — FENTANYL CITRATE (PF) 100 MCG/2ML IJ SOLN
INTRAMUSCULAR | Status: AC
  Filled 2021-12-31: qty 2

## 2021-12-31 MED ORDER — ONDANSETRON HCL 4 MG/2ML IJ SOLN: 4.0000 mg | Freq: Once | INTRAMUSCULAR | Status: DC | PRN

## 2021-12-31 MED ORDER — PROPOFOL 500 MG/50ML IV EMUL
INTRAVENOUS | Status: DC | PRN
  Administered 2021-12-31: 200 ug/kg/min via INTRAVENOUS

## 2021-12-31 MED ORDER — ONDANSETRON HCL 4 MG/2ML IJ SOLN
INTRAMUSCULAR | Status: DC | PRN
  Administered 2021-12-31: 4 mg via INTRAVENOUS

## 2021-12-31 MED ORDER — CEFAZOLIN SODIUM-DEXTROSE 2-4 GM/100ML-% IV SOLN
INTRAVENOUS | Status: AC
  Filled 2021-12-31: qty 100

## 2021-12-31 MED ORDER — OXYCODONE HCL 5 MG/5ML PO SOLN: 5.0000 mg | Freq: Once | ORAL | Status: DC | PRN

## 2021-12-31 MED ORDER — PROPOFOL 1000 MG/100ML IV EMUL
INTRAVENOUS | Status: AC
  Filled 2021-12-31: qty 100

## 2021-12-31 MED ORDER — LIDOCAINE HCL (CARDIAC) PF 100 MG/5ML IV SOSY
PREFILLED_SYRINGE | INTRAVENOUS | Status: DC | PRN
Start: 2021-12-31 — End: 2021-12-31
  Administered 2021-12-31: 40 mg via INTRAVENOUS

## 2021-12-31 MED ORDER — PROPOFOL 10 MG/ML IV BOLUS
INTRAVENOUS | Status: DC | PRN
  Administered 2021-12-31: 50 mg via INTRAVENOUS

## 2021-12-31 MED ORDER — HYDROMORPHONE HCL 1 MG/ML IJ SOLN: 0.2500 mg | INTRAMUSCULAR | Status: DC | PRN

## 2021-12-31 MED ORDER — LACTATED RINGERS IV SOLN: INTRAVENOUS | Status: DC

## 2021-12-31 MED ORDER — SODIUM CHLORIDE 0.9 % IR SOLN
Status: DC | PRN
  Administered 2021-12-31: 1000 mL

## 2021-12-31 SURGICAL SUPPLY — 16 items
BAG DRAIN URO-CYSTO SKYTR STRL (DRAIN) ×3 IMPLANT
BAG DRN UROCATH (DRAIN) ×2
DRAPE HYSTEROSCOPY (MISCELLANEOUS) ×3 IMPLANT
GLOVE BIO SURGEON STRL SZ 6 (GLOVE) ×3 IMPLANT
GLOVE BIO SURGEON STRL SZ7 (GLOVE) ×1 IMPLANT
GLOVE BIOGEL PI IND STRL 6.5 (GLOVE) ×2 IMPLANT
GLOVE BIOGEL PI IND STRL 7.0 (GLOVE) IMPLANT
GLOVE BIOGEL PI INDICATOR 6.5 (GLOVE) ×1
GLOVE BIOGEL PI INDICATOR 7.0 (GLOVE) ×1
GLOVE ECLIPSE 7.0 STRL STRAW (GLOVE) ×1 IMPLANT
GOWN STRL REUS W/TWL LRG LVL3 (GOWN DISPOSABLE) ×4 IMPLANT
KIT TURNOVER CYSTO (KITS) ×3 IMPLANT
MANIFOLD NEPTUNE II (INSTRUMENTS) ×3 IMPLANT
PACK CYSTO (CUSTOM PROCEDURE TRAY) ×3 IMPLANT
SYSTEM URETHRAL BULK BULKAMID (Female Continence) ×1 IMPLANT
TUBE CONNECTING 12X1/4 (SUCTIONS) ×3 IMPLANT

## 2021-12-31 NOTE — Op Note (Signed)
Operative Note ? ?Preoperative Diagnosis: stress urinary incontinence ? ?Postoperative Diagnosis: same ? ?Procedures performed:  ?Cystoscopy with urethral bulking (Bulkamid) ? ?Implants:  ?Implant Name Type Inv. Item Serial No. Manufacturer Lot No. LRB No. Used Action  ?SYSTEM Hinda Kehr - [id number] Female Continence SYSTEM URETHRAL BULK BULKAMID  [employer] [id number] N/A 1 Implanted  ? ? ?Attending Surgeon: Sherlene Shams, MD ? ?Anesthesia: MAC ? ?Findings: Normal urethral mucosa on cystourethroscopy ? ?Specimens: none ? ?Estimated blood loss: minimal ? ?IV fluids: 700 mL ? ?Urine output: not recorded ? ?Complications: none ? ?Procedure in Detail: ?After informed consent was obtained the patient was taken to the operating room where MAC anesthesia was induced and found to be adequate.  A 0 degree cystoscope was inserted into the urethra into the bladder.   The cystoscope was inserted to the level of the bladder neck.  The needle was inserted 2 cm and the scope was pulled back into the urethra 2 cm.  The needle was primed. The needle was inserted bevel up at the 5 o'clock position and the Bulkamid was injected to obtain coaptation.  This was repeated at the 2 o'clock,  10 o'clock and 7 o'clock positions.   A total of 2- 75m syringes were used and good circumferential coaptation was noted.  The patient tolerated the procedure well was taken to recovery room in stable condition all counts were correct x2. ? ? ?MJaquita Folds MD ? ?

## 2021-12-31 NOTE — Discharge Instructions (Addendum)
Taking Care of Yourself after Bulkamid ? ?Drink plenty of water for a day or two following your procedure. Try to have about 8 ounces (one cup) at a time, and do this 6 times or more per day unless you have fluid restrictitons ?AVOID irritative beverages such as coffee, tea, soda, alcoholic or citrus drinks for a day or two, as this may cause burning with urination. ? For the first 1-2 days after the procedure, your urine may be pink or red in color. You may have some blood in your urine as a normal side effect of the procedure. Large amounts of bleeding or difficulty urinating are NOT normal. Call the nurse line if this happens or go to the nearest Emergency Room if the bleeding is heavy or you cannot urinate at all and it is after hours. If you need to be catheterized, ask for a pediatric catheter to be used (size 10 or 12-French) so the material is not pushed out of place.   ?You may experience some discomfort or a burning sensation with urination after having this procedure. You can use over the counter Azo or pyridium to help with burning and follow the instructions on the packaging. If it does not improve within 1-2 days, or other symptoms appear (fever, chills, or difficulty urinating) call the office to speak to a nurse.  ?You may return to normal daily activities such as work, school, driving, exercising and housework on the day after the procedure. ? ? ? ?Post Anesthesia Home Care Instructions ? ?Activity: ?Get plenty of rest for the remainder of the day. A responsible individual must stay with you for 24 hours following the procedure.  ?For the next 24 hours, DO NOT: ?-Drive a car ?-Advertising copywriter ?-Drink alcoholic beverages ?-Take any medication unless instructed by your physician ?-Make any legal decisions or sign important papers. ? ?Meals: ?Start with liquid foods such as gelatin or soup. Progress to regular foods as tolerated. Avoid greasy, spicy, heavy foods. If nausea and/or vomiting occur,  drink only clear liquids until the nausea and/or vomiting subsides. Call your physician if vomiting continues. ? ?Special Instructions/Symptoms: ?Your throat may feel dry or sore from the anesthesia or the breathing tube placed in your throat during surgery. If this causes discomfort, gargle with warm salt water. The discomfort should disappear within 24 hours. ? ? ? ?CYSTOSCOPY HOME CARE INSTRUCTIONS ? ?Activity: ?Rest for the remainder of the day.  Do not drive or operate equipment today.  You may resume normal activities in one to two days as instructed by your physician.  ? ?Meals: ?Drink plenty of liquids and eat light foods such as gelatin or soup this evening.  You may return to a normal meal plan tomorrow. ? ?Return to Work: ?You may return to work in one to two days or as instructed by your physician. ? ?Special Instructions / Symptoms: ?Call your physician if any of these symptoms occur: ? ? -persistent or heavy bleeding ? -bleeding which continues after first few urination ? -large blood clots that are difficult to pass ? -urine stream diminishes or stops completely ? -fever equal to or higher than 101 degrees Farenheit. ? -cloudy urine with a strong, foul odor ? -severe pain ? ?Females should always wipe from front to back after elimination.  You may feel some burning pain when you urinate.  This should disappear with time.  Applying moist heat to the lower abdomen or a hot tub bath may help relieve the pain.  ? ? ? ? ? ?

## 2021-12-31 NOTE — Transfer of Care (Signed)
Immediate Anesthesia Transfer of Care Note ? ?Patient: Lealer H Hobdy ? ?Procedure(s) Performed: Procedure(s) (LRB): ?URETHRAL BULKING (N/A) ?EXAM UNDER ANESTHESIA ?CYSTOSCOPY ? ?Patient Location: PACU ? ?Anesthesia Type: MAC ? ?Level of Consciousness: awake, alert  and oriented ? ?Airway & Oxygen Therapy: Patient Spontanous Breathing and Patient connected to face mask oxygen ? ?Post-op Assessment: Report given to PACU RN and Post -op Vital signs reviewed and stable ? ?Post vital signs: Reviewed and stable ? ?Complications: No apparent anesthesia complications ? ?Last Vitals:  ?Vitals Value Taken Time  ?BP 133/84 12/31/21 1155  ?Temp    ?Pulse 64 12/31/21 1157  ?Resp 14 12/31/21 1157  ?SpO2 97 % 12/31/21 1157  ?Vitals shown include unvalidated device data. ? ?Last Pain:  ?Vitals:  ? 12/31/21 0922  ?TempSrc: Oral  ?PainSc: 0-No pain  ?   ? ?Patients Stated Pain Goal: 6 (12/31/21 7048) ? ?Complications: No notable events documented. ?

## 2021-12-31 NOTE — Anesthesia Postprocedure Evaluation (Signed)
Anesthesia Post Note ? ?Patient: Carol Lynch ? ?Procedure(s) Performed: URETHRAL BULKING (Urethra) ?EXAM UNDER ANESTHESIA (Urethra) ?CYSTOSCOPY (Urethra) ? ?  ? ?Patient location during evaluation: PACU ?Anesthesia Type: MAC ?Level of consciousness: awake and alert ?Pain management: pain level controlled ?Vital Signs Assessment: post-procedure vital signs reviewed and stable ?Respiratory status: spontaneous breathing, nonlabored ventilation, respiratory function stable and patient connected to nasal cannula oxygen ?Cardiovascular status: stable and blood pressure returned to baseline ?Postop Assessment: no apparent nausea or vomiting ?Anesthetic complications: no ? ? ?No notable events documented. ? ?Last Vitals:  ?Vitals:  ? 12/31/21 1215 12/31/21 1230  ?BP: 124/81 112/77  ?Pulse: 63 68  ?Resp: 15 16  ?Temp:    ?SpO2: 94% 95%  ?  ?Last Pain:  ?Vitals:  ? 12/31/21 1155  ?TempSrc:   ?PainSc: 0-No pain  ? ? ?  ?  ?  ?  ?  ?  ? ?Barnet Glasgow ? ? ? ? ?

## 2021-12-31 NOTE — Interval H&P Note (Signed)
History and Physical Interval Note: ? ?12/31/2021 ?11:01 AM ? ?Carol Lynch  has presented today for surgery, with the diagnosis of stress urinary incontinence.  The various methods of treatment have been discussed with the patient and family. After consideration of risks, benefits and other options for treatment, the patient has consented to  Procedure(s): ?URETHRAL BULKING (N/A) as a surgical intervention.   ? ?Vitals:  ? 12/31/21 0922  ?BP: 129/83  ?Pulse: 71  ?Resp: 20  ?Temp: 98.6 ?F (37 ?C)  ?SpO2: 96%  ? ? ?Gen: NAD ?Lungs: normal respirations ?Abd: soft, nontender ? ?The patient's history has been reviewed, patient examined, no change in status, stable for surgery.  I have reviewed the patient's chart and labs.  Questions were answered to the patient's satisfaction.   ? ? ?Jaquita Folds ? ? ?

## 2022-01-01 ENCOUNTER — Ambulatory Visit (INDEPENDENT_AMBULATORY_CARE_PROVIDER_SITE_OTHER): Payer: BC Managed Care – PPO

## 2022-01-01 ENCOUNTER — Encounter (HOSPITAL_BASED_OUTPATIENT_CLINIC_OR_DEPARTMENT_OTHER): Payer: Self-pay | Admitting: Obstetrics and Gynecology

## 2022-01-01 DIAGNOSIS — N393 Stress incontinence (female) (male): Secondary | ICD-10-CM

## 2022-01-01 NOTE — Progress Notes (Signed)
Urogyn Nurse Voiding Trial Note ? ?Carol Lynch underwent Urethral bulking and Cystoscopy on 12/31/2021.  She presents today for a voiding trial .  ? ?Patient was identified with 2 indicators. of NS was instilled into the bladder via the catheter. The catheter was removed and patient was instructed to void into the urinary hat. She voided . The post void residual measured by bladder scan was 73ml. She passed the voiding trial and a catheter was not replaced.  ? ?The patient received aftercare instructions and will follow up as scheduled.  ?  ?

## 2022-01-01 NOTE — Patient Instructions (Signed)
Keep all scheduled post op appointments.  ?Please feel free to call us if you have any questions or concerns. ?

## 2022-01-02 ENCOUNTER — Encounter (HOSPITAL_BASED_OUTPATIENT_CLINIC_OR_DEPARTMENT_OTHER): Payer: Self-pay | Admitting: *Deleted

## 2022-01-28 ENCOUNTER — Encounter: Payer: BC Managed Care – PPO | Admitting: Obstetrics and Gynecology

## 2022-01-28 NOTE — Progress Notes (Deleted)
[facility]  Date of Visit: 01/28/2022  History of Present Illness: [name] is a 56 y.o. female scheduled today for a post-operative visit.   Surgery: s/p urethral bulking on 12/31/21  She did not pass her postoperative void trial and had a successful voiding trial on 01/01/22.    Today she reports ***  UTI in the last 6 weeks? {YES/NO:21197} Pain? {YES/NO:21197} She {HAS HAS LKG:40102} returned to her normal activity (except for postop restrictions) Vaginal bulge? {YES/NO:21197} Stress incontinence: {YES/NO:21197} Urgency/frequency: {YES/NO:21197} Urge incontinence: {YES/NO:21197} Voiding dysfunction: {YES/NO:21197} Bowel issues: {YES/NO:21197}   Medications: She has a current medication list which includes the following prescription(s): alprazolam, vitamin d3, estradiol, eszopiclone, fluticasone, magnesium, olmesartan, pantoprazole, probiotic daily, synthroid, and vortioxetine hbr.   Allergies: Patient has No Known Allergies.   Physical Exam: There were no vitals taken for this visit.  Abdomen: {Exam; abdomen:14935::"soft, non-tender, without masses or organomegaly"} ***Incisions: {Exam; incision:13523}.  Pelvic Examination: Vagina: Incisions {Exam; incision:13523}. Sutures are {DESC; PRESENT/NOT PRESENT:21021351} at incision line and there {ACTION; IS/IS VOZ:[id number]} granulation tissue. No tenderness along the anterior or posterior vagina. No apical tenderness. No pelvic masses. ***No visible or palpable mesh.  POP-Q:     No data to display         ---------------------------------------------------------  Assessment and Plan: No diagnosis found.  - ***Pathology results were reviewed with the patient today and she verbalized understanding that the results were ***benign.  - ***The patient was given a copy of her operative note ***and pathology report*** for her records. - Can resume regular activity including exercise and intercourse,  if desired.  -  Discussed avoidance of heavy lifting and straining long term to reduce the risk of recurrance.   All questions answered.   No follow-ups on file.

## 2022-06-10 ENCOUNTER — Encounter: Payer: Self-pay | Admitting: *Deleted

## 2023-02-06 ENCOUNTER — Other Ambulatory Visit (HOSPITAL_COMMUNITY): Payer: Self-pay | Admitting: Family Medicine

## 2023-02-06 DIAGNOSIS — R1013 Epigastric pain: Secondary | ICD-10-CM

## 2023-02-06 DIAGNOSIS — R109 Unspecified abdominal pain: Secondary | ICD-10-CM

## 2023-02-24 ENCOUNTER — Encounter (HOSPITAL_COMMUNITY): Payer: Self-pay

## 2023-02-24 ENCOUNTER — Ambulatory Visit (HOSPITAL_COMMUNITY)
Admission: RE | Admit: 2023-02-24 | Discharge: 2023-02-24 | Disposition: A | Discharge status: Home / Self Care | Payer: BC Managed Care – PPO | Source: Ambulatory Visit | Attending: Family Medicine | Admitting: Family Medicine

## 2023-02-24 DIAGNOSIS — R109 Unspecified abdominal pain: Secondary | ICD-10-CM | POA: Insufficient documentation

## 2023-02-24 DIAGNOSIS — R1013 Epigastric pain: Secondary | ICD-10-CM | POA: Diagnosis present

## 2023-02-24 MED ORDER — IOHEXOL 300 MG/ML  SOLN
100.0000 mL | Freq: Once | INTRAMUSCULAR | Status: AC | PRN
  Administered 2023-02-24: 100 mL via INTRAVENOUS

## 2023-11-14 ENCOUNTER — Emergency Department (HOSPITAL_BASED_OUTPATIENT_CLINIC_OR_DEPARTMENT_OTHER): Admission: EM | Admit: 2023-11-14 | Discharge: 2023-11-14 | Disposition: A | Discharge status: Home / Self Care

## 2023-11-14 ENCOUNTER — Other Ambulatory Visit: Payer: Self-pay

## 2023-11-14 ENCOUNTER — Encounter (HOSPITAL_BASED_OUTPATIENT_CLINIC_OR_DEPARTMENT_OTHER): Payer: Self-pay | Admitting: Emergency Medicine

## 2023-11-14 DIAGNOSIS — I10 Essential (primary) hypertension: Secondary | ICD-10-CM | POA: Diagnosis not present

## 2023-11-14 DIAGNOSIS — R112 Nausea with vomiting, unspecified: Secondary | ICD-10-CM | POA: Insufficient documentation

## 2023-11-14 DIAGNOSIS — E860 Dehydration: Secondary | ICD-10-CM | POA: Diagnosis not present

## 2023-11-14 DIAGNOSIS — E039 Hypothyroidism, unspecified: Secondary | ICD-10-CM | POA: Diagnosis not present

## 2023-11-14 LAB — COMPREHENSIVE METABOLIC PANEL WITH GFR
ALT: 15 U/L (ref 0–44)
AST: 18 U/L (ref 15–41)
Albumin: 4.7 g/dL (ref 3.5–5.0)
Alkaline Phosphatase: 47 U/L (ref 38–126)
Anion gap: 11 (ref 5–15)
BUN: 23 mg/dL — ABNORMAL HIGH (ref 6–20)
CO2: 26 mmol/L (ref 22–32)
Calcium: 9.4 mg/dL (ref 8.9–10.3)
Chloride: 98 mmol/L (ref 98–111)
Creatinine, Ser: 0.89 mg/dL (ref 0.44–1.00)
GFR, Estimated: >60 mL/min (ref >60)
Glucose, Bld: 98 mg/dL (ref 70–99)
Potassium: 3.6 mmol/L (ref 3.5–5.1)
Sodium: 135 mmol/L (ref 135–145)
Total Bilirubin: 1.2 mg/dL (ref 0.0–1.2)
Total Protein: 7.3 g/dL (ref 6.5–8.1)

## 2023-11-14 LAB — CBC
HCT: 48.4 % — ABNORMAL HIGH (ref 36.0–46.0)
Hemoglobin: 16.7 g/dL — ABNORMAL HIGH (ref 12.0–15.0)
MCH: 32.1 pg (ref 26.0–34.0)
MCHC: 34.5 g/dL (ref 30.0–36.0)
MCV: 92.9 fL (ref 80.0–100.0)
Platelets: 228 10*3/uL (ref 150–400)
RBC: 5.21 MIL/uL — ABNORMAL HIGH (ref 3.87–5.11)
RDW: 12.6 % (ref 11.5–15.5)
WBC: 5.4 10*3/uL (ref 4.0–10.5)
nRBC: 0 % (ref 0.0–0.2)

## 2023-11-14 LAB — URINALYSIS, ROUTINE W REFLEX MICROSCOPIC
Bilirubin Urine: NEGATIVE
Glucose, UA: NEGATIVE mg/dL
Hgb urine dipstick: NEGATIVE
Ketones, ur: 15 mg/dL — AB
Leukocytes,Ua: NEGATIVE
Nitrite: NEGATIVE
Protein, ur: NEGATIVE mg/dL
Specific Gravity, Urine: 1.012 (ref 1.005–1.030)
pH: 5.5 (ref 5.0–8.0)

## 2023-11-14 LAB — RESP PANEL BY RT-PCR (RSV, FLU A&B, COVID)  RVPGX2
Influenza A by PCR: NEGATIVE
Influenza B by PCR: NEGATIVE
Resp Syncytial Virus by PCR: NEGATIVE
SARS Coronavirus 2 by RT PCR: NEGATIVE

## 2023-11-14 LAB — LIPASE, BLOOD: Lipase: 30 U/L (ref 11–51)

## 2023-11-14 MED ORDER — ONDANSETRON HCL 4 MG/2ML IJ SOLN
4.0000 mg | Freq: Once | INTRAMUSCULAR | Status: AC
  Administered 2023-11-14: 4 mg via INTRAVENOUS
  Filled 2023-11-14: qty 2

## 2023-11-14 MED ORDER — SODIUM CHLORIDE 0.9 % IV BOLUS
1000.0000 mL | Freq: Once | INTRAVENOUS | Status: AC
  Administered 2023-11-14: 1000 mL via INTRAVENOUS

## 2023-11-14 NOTE — ED Triage Notes (Signed)
 Pt via pov from home with dehydration after having an intestinal virus for a few days. She reports that she vomited every 2 hours and then after that ended on Tuesday night, she had a headache on Wednesday. She has been unable to eat much since Monday. She has been able to keep some fluids down and has been drinking as much as she can, but has only been able to urinate small amounts and was sent by PCP for eval and potentially IV fluids.  Pt alert & oriented, NAD noted.

## 2023-11-14 NOTE — ED Notes (Signed)
 Patient notified of need for urine sample to complete eval/assessment. Specimen cup provided and instructions for clean catch given. Patient will notify staff when able to provide

## 2023-11-14 NOTE — ED Notes (Signed)
Pt aware urine specimen ordered. Pt reports inability to provide specimen at this time. Specimen collection device provided to patient. 

## 2023-11-14 NOTE — ED Provider Notes (Signed)
 [place] EMERGENCY DEPARTMENT AT [facility] Provider Note   CSN: [id number] Arrival date & time: 11/14/23  7829     History  Chief Complaint  Patient presents with   Dehydration    Carol Lynch is a 58 y.o. female with medical history of GERD, hypertension, hypothyroid, stress urinary incontinence.  The patient presents to the ED for evaluation of nausea and vomiting.  Reports that on Monday night/Monday morning around 2 AM she developed nausea and vomiting that woke her from her sleep.  States that she vomited for 18 hours, vomiting subsided on Tuesday night and then she developed a headache on Wednesday.  She reports she has been unable to keep down food or water since Monday night.  States that she is concerned for dehydration.  She was seen by her PCP who ultimately sent her to the ED for further evaluation.  She reports that this morning she was able to keep down for saltine crackers.  She denies any diarrhea, fevers, dysuria, flank pain, sore throat, body aches or chills.  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  She denies any alcohol use.  She denies any cannabis use.  Endorsing nausea and vomiting.  Denies abdominal pain.  Denies chest pain or shortness of breath.  HPI     Home Medications Prior to Admission medications   Medication Sig Start Date End Date Taking? Authorizing Provider  ALPRAZolam Prudy Feeler) 0.25 MG tablet Take 0.25 mg by mouth at bedtime as needed for anxiety.    [provider]  Cholecalciferol (VITAMIN D3) 2000 UNITS TABS Take 1 tablet by mouth daily.    [provider]  estradiol (ESTRACE) 0.5 MG tablet Take 0.5 mg by mouth daily.    [provider]  Eszopiclone 3 MG TABS Take 3 mg by mouth at bedtime. Take immediately before bedtime    [provider]  fluticasone (FLONASE) 50 MCG/ACT nasal spray 1 spray 2 (two) times daily as needed. 05/16/15   [provider]  Magnesium  125 MG CAPS Take 1 capsule by mouth at bedtime.    [provider]  olmesartan (BENICAR) 20 MG tablet Take 20 mg by mouth daily.    [provider]  pantoprazole (PROTONIX) 40 MG tablet Take 40 mg by mouth daily.    [provider]  Probiotic Product (PROBIOTIC DAILY) CAPS Take 1 capsule by mouth daily.    [provider]  SYNTHROID 100 MCG tablet Take 1 tablet (100 mcg total) by mouth daily before breakfast. Patient taking differently: Take 100 mcg by mouth daily before breakfast. 05/04/14   Carlus Pavlov, MD  vortioxetine HBr (TRINTELLIX) 10 MG TABS tablet Take 10 mg by mouth daily.    [provider]      Allergies    Patient has no known allergies.    Review of Systems   Review of Systems  Constitutional:  Negative for fever.  Respiratory:  Negative for shortness of breath.   Cardiovascular:  Negative for chest pain.  Gastrointestinal:  Positive for nausea and vomiting. Negative for abdominal pain and diarrhea.  Genitourinary:  Negative for dysuria and flank pain.  All other systems reviewed and are negative.   Physical Exam Updated Vital Signs BP (!) 144/89   Pulse 62   Temp 98.7 F (37.1 C)   Resp 18   Ht 5\' 3"  (1.6 m)   Wt 63 kg   SpO2 95%  BMI 24.62 kg/m  Physical Exam Vitals and nursing note reviewed.  Constitutional:      General: She is not in acute distress.    Appearance: She is well-developed.  HENT:     Head: Normocephalic and atraumatic.  Eyes:     Conjunctiva/sclera: Conjunctivae normal.  Cardiovascular:     Rate and Rhythm: Normal rate and regular rhythm.     Heart sounds: No murmur heard. Pulmonary:     Effort: Pulmonary effort is normal. No respiratory distress.     Breath sounds: Normal breath sounds.  Abdominal:     Palpations: Abdomen is soft.     Tenderness: There is no abdominal tenderness.     Comments: Abdomen soft, nontender.  No overlying skin change.  No rebound or guarding.  No CVA  tenderness bilaterally.  Musculoskeletal:        General: No swelling.     Cervical back: Neck supple.  Skin:    General: Skin is warm and dry.     Capillary Refill: Capillary refill takes less than 2 seconds.  Neurological:     Mental Status: She is alert and oriented to person, place, and time.  Psychiatric:        Mood and Affect: Mood normal.     ED Results / Procedures / Treatments   Labs (all labs ordered are listed, but only abnormal results are displayed) Labs Reviewed  COMPREHENSIVE METABOLIC PANEL WITH GFR - Abnormal; Notable for the following components:      Result Value   BUN 23 (*)    All other components within normal limits  CBC - Abnormal; Notable for the following components:   RBC 5.21 (*)    Hemoglobin 16.7 (*)    HCT 48.4 (*)    All other components within normal limits  URINALYSIS, ROUTINE W REFLEX MICROSCOPIC - Abnormal; Notable for the following components:   Ketones, ur 15 (*)    All other components within normal limits  RESP PANEL BY RT-PCR (RSV, FLU A&B, COVID)  RVPGX2  LIPASE, BLOOD    EKG None  Radiology No results found.  Procedures Procedures   Medications Ordered in ED Medications  ondansetron (ZOFRAN) injection 4 mg (4 mg Intravenous Given 11/14/23 1118)  sodium chloride 0.9 % bolus 1,000 mL (0 mLs Intravenous Stopped 11/14/23 1257)    ED Course/ Medical Decision Making/ A&P  Medical Decision Making Amount and/or Complexity of Data Reviewed Labs: ordered.  Risk Prescription drug management.   58 year old female presents for evaluation.  Please see HPI for further details.  On exam patient is afebrile and nontachycardic.  Her lung sounds are clear bilaterally, she is nonhypoxic.  Abdomen is soft and compressible with no tenderness noted.  No rebound or guarding, no CVA tenderness, no overlying skin change.  Neurological examinations at baseline.  Patient here for concerns of dehydration mainly.  Will collect labs, provide  IV fluids and Zofran.  No tenderness on exam of her abdomen so do not see need to image at this time.  Patient CBC without leukocytosis or anemia.  Metabolic panel without electrolyte derangement, no elevated LFTs, anion gap 11.  Lipase WNL 30.  Viral panel negative for all.  Urinalysis shows ketones.  Patient reports after receiving 1 L of fluid and Zofran she feels much better.  She is able to tolerate p.o. here in the department.  Will discharge patient at this time.  She advises she has Zofran at home.  Have advised her to follow-up  with her PCP and have provided return precautions.  She voiced understanding.  Stable discharge.   Final Clinical Impression(s) / ED Diagnoses Final diagnoses:  Nausea and vomiting, unspecified vomiting type  Dehydration    Rx / DC Orders ED Discharge Orders     None         Clent Ridges 11/14/23 1302    Durwin Glaze, MD 11/14/23 [phone]

## 2023-11-14 NOTE — Discharge Instructions (Signed)
 It was a pleasure taking part in your care.  As discussed, your workup appears reassuring.  Please continue pushing fluids such as water, Pedialyte or other electrolyte supplementation beverages.  Please take Zofran every 6 hours as needed for nausea and vomiting.  Please follow-up with your PCP.  Return to the ED with any new or worsening symptoms.

## 2023-11-14 NOTE — ED Notes (Signed)
 Improved nausea. Tolerating fluids

## 2023-11-14 NOTE — ED Notes (Signed)
Reviewed discharge instructions, medications, and home care with pt. Pt verbalized understanding and had no further questions. Pt exited ED without complications.

## 2024-04-21 ENCOUNTER — Other Ambulatory Visit: Payer: Self-pay | Admitting: Obstetrics and Gynecology

## 2024-04-21 DIAGNOSIS — R928 Other abnormal and inconclusive findings on diagnostic imaging of breast: Secondary | ICD-10-CM

## 2024-04-26 ENCOUNTER — Ambulatory Visit

## 2024-04-26 ENCOUNTER — Ambulatory Visit
Admission: RE | Admit: 2024-04-26 | Discharge: 2024-04-26 | Disposition: A | Discharge status: Home / Self Care | Source: Ambulatory Visit | Attending: Obstetrics and Gynecology | Admitting: Obstetrics and Gynecology

## 2024-04-26 DIAGNOSIS — R928 Other abnormal and inconclusive findings on diagnostic imaging of breast: Secondary | ICD-10-CM

## 2024-09-09 NOTE — H&P (Signed)
------------------------------------------------------------------------------- °  Attestation signed by Dorothyann Jenkins Ku, MD at 09/10/2024 12:39 PM I saw and evaluated the patient and reviewed the resident's note. I agree with the resident's findings and plan.   -------------------------------------------------------------------------------  Carol Lynch is a 59 y.o. F w/ stress-dominant mixed urinary incontinence and thickened endometrium  presenting for midurethral sling placement, cystoscopy, hysteroscopy with polypectomy and dilatation and curettage .  07/20/2024 TVUS:   -UTERUS: Anteverted, size = 3.8 x 5.8 x 9.1 cm., endometrium: Width = 12 mm.   1.  Mildly heterogeneous thickened endometrium. Given patient's age, differential includes endometrial hyperplasia versus neoplasia. Recommend gynecology consultation and tissue sampling as clinically indicated. 2.  Ill-defined myometrial/endometrial junction with associated multiple tiny cysts which could suggest adenomyosis.  3.  Neither ovary is identified, likely due to interposed bowel gas.  07/28/2024 Endometrial Biopsy: benign endocervical mucosa with fibrosis, suggestive of polyp, and benign endocervical epithelium, scant benign squames, no endometrial tissue identified  The surgical history has been reviewed and remains accurate without interval change. The patient was re-examined and patient's physiologic condition has not changed significantly in the last 30 days. No new pharmacological allergies or types of therapy have been initiated.   EXAM Vitals:   09/10/24 1132  BP: 134/89  Pulse: 72  Resp: 17  Temp: 96.7 F (35.9 C)  SpO2: 98%    General:  NAD, A&O x4 Abd:  soft, NT, ND CV: regular rate Resp: breathing room air comfortably Pelvic exam deferred    IMPRESSION/PLAN:  The condition still exists that makes this procedure necessary. The treatment plan remains the same, without new options for care. The  patient understands the potential benefits and risks of surgery including bleeding, infection, and injury to surrounding organs including nerves and blood vessels. Surgical consents have been signed and placed on the chart.   The patient has been cleared for surgery in an ambulatory setting.  Amadeo Free, MD Obstetrics & Gynecology, PGY-2

## 2024-09-10 ENCOUNTER — Encounter (HOSPITAL_BASED_OUTPATIENT_CLINIC_OR_DEPARTMENT_OTHER): Payer: Self-pay | Admitting: Emergency Medicine

## 2024-09-10 ENCOUNTER — Emergency Department (HOSPITAL_BASED_OUTPATIENT_CLINIC_OR_DEPARTMENT_OTHER): Admission: EM | Admit: 2024-09-10 | Discharge: 2024-09-10 | Disposition: A | Discharge status: Home / Self Care

## 2024-09-10 DIAGNOSIS — R339 Retention of urine, unspecified: Secondary | ICD-10-CM | POA: Insufficient documentation

## 2024-09-10 NOTE — Anesthesia Postprocedure Evaluation (Signed)
 Patient: Carol Lynch  Procedure Summary     Date: 09/10/24 Room / Location: [facility]   Anesthesia Start: 1235 Anesthesia Stop: 1349   Procedures:      Midurethral sling and cystoscopy (Urethra)     HYSTEROSCOPY WITH POLYPECTOMY AND DILATATION AND CURETTAGE (Uterus) Diagnosis:      Postmenopausal bleeding     SUI (stress urinary incontinence, female)     (Postmenopausal bleeding [N95.0])     (SUI (stress urinary incontinence, female) [N39.3])   Surgeons: Dorothyann Jenkins Ku, MD Responsible Provider: Greig Venson Baston, MD   Anesthesia Type: general ASA Status: 2       Anesthesia Type: general  Vitals Value Taken Time  BP 155/97 09/10/24 17:00  Temp 97 F (36.1 C) 09/10/24 17:00  Pulse 83 09/10/24 17:00  Resp 16 09/10/24 17:00  SpO2 97 % 09/10/24 17:00    There were no known notable events for this encounter.  Anesthesia Post Evaluation  Final anesthesia type: general Patient location during evaluation: PACU Patient participation: Patient participated Level of consciousness: awake and alert Pain management: adequately controlled during entire PACU stay Post-op nausea and vomiting?: present while in PACU, now controlled (additional zofran ) Post-op vital signs: post-procedure vital signs are stable Patient temperature: Normothermic Cardiovascular status: hemodynamically stable Respiratory status: Stable, room air, spontaneous Hydration status: adequately hydrated Post-op disposition: Home Anesthesia post-op complications?:no complications Comments: Patent airway, conversing easily. Tolerating po. Appropriate for d/c home with family.

## 2024-09-10 NOTE — ED Notes (Signed)
Foley catheter converted to leg bag

## 2024-09-10 NOTE — ED Provider Notes (Signed)
 " [place] EMERGENCY DEPARTMENT AT [facility] Provider Note   CSN: [id number] Arrival date & time: 09/10/24  2145     Patient presents with: Urinary Retention   Carol Lynch is a 59 y.o. female.   HPI   Surgery today.  Unable to urinate.  Otherwise, no complaints.  Does endorse some some lower abdominal pain.  No fever no chills.  No nausea vomit diarrhea.  Passing flatulence.  Previous medical history reviewed : Postop day 0 status post HSC, polypectomy, and MUS    Prior to Admission medications  Medication Sig Start Date End Date Taking? Authorizing Provider  ALPRAZolam (XANAX) 0.25 MG tablet Take 0.25 mg by mouth at bedtime as needed for anxiety.    [provider]  Cholecalciferol (VITAMIN D3) 2000 UNITS TABS Take 1 tablet by mouth daily.    [provider]  estradiol (ESTRACE) 0.5 MG tablet Take 0.5 mg by mouth daily.    [provider]  Eszopiclone 3 MG TABS Take 3 mg by mouth at bedtime. Take immediately before bedtime    [provider]  fluticasone (FLONASE) 50 MCG/ACT nasal spray 1 spray 2 (two) times daily as needed. 05/16/15   [provider]  Magnesium 125 MG CAPS Take 1 capsule by mouth at bedtime.    [provider]  olmesartan (BENICAR) 20 MG tablet Take 20 mg by mouth daily.    [provider]  pantoprazole (PROTONIX) 40 MG tablet Take 40 mg by mouth daily.    [provider]  Probiotic Product (PROBIOTIC DAILY) CAPS Take 1 capsule by mouth daily.    [provider]  SYNTHROID  100 MCG tablet Take 1 tablet (100 mcg total) by mouth daily before breakfast. Patient taking differently: Take 100 mcg by mouth daily before breakfast. 05/04/14   Trixie File, MD  vortioxetine HBr (TRINTELLIX) 10 MG TABS tablet Take 10 mg by mouth daily.    [provider]    Allergies: Patient has no known allergies.    Review of Systems  Constitutional:  Negative for chills and  fever.  HENT:  Negative for ear pain and sore throat.   Eyes:  Negative for pain and visual disturbance.  Respiratory:  Negative for cough and shortness of breath.   Cardiovascular:  Negative for chest pain and palpitations.  Gastrointestinal:  Negative for abdominal pain and vomiting.  Genitourinary:  Negative for dysuria and hematuria.  Musculoskeletal:  Negative for arthralgias and back pain.  Skin:  Negative for color change and rash.  Neurological:  Negative for seizures and syncope.  All other systems reviewed and are negative.   Updated Vital Signs BP 131/83   Pulse (!) 102   Temp 98 F (36.7 C) (Oral)   Resp 18   SpO2 97%   Physical Exam Vitals and nursing note reviewed.  Constitutional:      General: She is not in acute distress.    Appearance: She is well-developed.  HENT:     Head: Normocephalic and atraumatic.  Eyes:     Conjunctiva/sclera: Conjunctivae normal.  Cardiovascular:     Rate and Rhythm: Normal rate and regular rhythm.     Heart sounds: No murmur heard. Pulmonary:     Effort: Pulmonary effort is normal. No respiratory distress.     Breath sounds: Normal breath sounds.  Abdominal:     Palpations: Abdomen is soft.     Tenderness: There is no abdominal tenderness.  Musculoskeletal:  General: No swelling.     Cervical back: Neck supple.  Skin:    General: Skin is warm and dry.     Capillary Refill: Capillary refill takes less than 2 seconds.  Neurological:     Mental Status: She is alert.  Psychiatric:        Mood and Affect: Mood normal.     (all labs ordered are listed, but only abnormal results are displayed) Labs Reviewed - No data to display  EKG: None  Radiology: No results found.   Procedures   Medications Ordered in the ED - No data to display                                  Medical Decision Making    HPI:    Surgery today.  Unable to urinate.  Otherwise, no complaints.  Does endorse some some lower abdominal  pain.  No fever no chills.  No nausea vomit diarrhea.  Passing flatulence.  Previous medical history reviewed : Postop day 0 status post HSC, polypectomy, and MUS   MDM:   Upon examination, patient hemodynamically stable. A&O x 3 with GCS 15.  Pain to palpation lower abdomen over the bladder.  Will place Foley catheter.  Will have nursing staff to place Foley catheter.  Will reassess after this.  No indication for labs.  No fever no chills.  Hemodynamically stable.  No concerns for any kind of postop complication.  Will be to place Foley and have her follow-up with her gynecologist.  Reevaluation:   Upon reexamination, patient hemodynamically stable.  Remains A&O x 3 with GCS 15.   Successful placement of Foley catheter.  Drained over 600 cc of fluid.  Soft and benign abdomen.  Feels much better.  Patient will follow-up with her gynecologist.  Strict return precautions.  Will send home with leg bag.        Final diagnoses:  Urinary retention    ED Discharge Orders     None          Simon Lavonia SAILOR, MD 09/10/24 2327  "

## 2024-09-10 NOTE — ED Notes (Signed)

## 2024-09-10 NOTE — Progress Notes (Signed)
" °   09/10/24 1430  Vitals  BP  (pt in BR)  Resp 14  SpO2 94 %  Urethral Catheter Non-Latex 16 Fr.  Placement Date/Time: 09/10/24 1317   Catheter Type: Non-Latex  Tube Size (Fr.): 16 Fr.  Catheter Balloon Size: 10 mL  Urine Returned: Yes  Irrigation Amount (mL) 300 mL     Bladder instilled with 300cc of fluid. Foley cath removed after. Pt tolerated well. Awaiting void trial. Per Ester Debby Burkes, MD, pt should void 150cc before discharging home.  "

## 2024-09-10 NOTE — Telephone Encounter (Signed)
 PAL CALL: [facility]PMRS  Name: Carol Lynch DOB: 1965-08-20 MRN: [id number]  Today's Date: Fri 09/10/2024  Reason for call: Post-op urinary retention  POD0 s/p HSC, polypectomy and MUS.  Patient reports she had two small volume voids in the PACU and was discharged to home. Patient states since she left the hospital around 5PM. Patient states that since she left the hospital she had not voided, however states that she was just able to void about a tablespoon amount after sitting on the toilet for a prolonged period of time and straining significantly. States she had been staying well hydrated and drinking lots of water until she realized she was unable to void.   PLAN - Instructed to proceed to the closest emergency department for evaluation of urinary retention. Discussed importance of evaluation to determine if there is urinary retention and to receive appropriate bladder decompression to prevent bladder injury. - Follow up as below  Scheduled Future Appointments       Provider Department Dept Phone Center   10/27/2024 11:30 AM Dorothyann Jenkins Ku [facility] [place]  - [place]  Urological Gynecology [phone] [facility] [address]       Maurilio Levonne Coventry, MD PGY-3 Obstetrics and Gynecology

## 2024-09-10 NOTE — Discharge Instructions (Signed)
 Please give your surgeons a call.  They should dictate when this Foley is removed.  Come back to the ED if having any fever or chills.

## 2024-09-10 NOTE — ED Triage Notes (Signed)
 Bladder sling place today  Has not urinated since leaving hospital , discomfort my bladder feels like it is going to explode  Distended and firm abdo
